# Patient Record
Sex: Female | Born: 2000 | Race: Black or African American | Hispanic: No | Smoking: Never smoker
Health system: Southern US, Community
[De-identification: ages and names within clinical notes are randomized; demographics above are authoritative.]

## PROBLEM LIST (undated history)

## (undated) DIAGNOSIS — O139 Gestational [pregnancy-induced] hypertension without significant proteinuria, unspecified trimester: Secondary | ICD-10-CM

## (undated) DIAGNOSIS — I514 Myocarditis, unspecified: Secondary | ICD-10-CM

## (undated) DIAGNOSIS — J45909 Unspecified asthma, uncomplicated: Secondary | ICD-10-CM

## (undated) HISTORY — PX: CARDIAC SURGERY: SHX584

## (undated) HISTORY — DX: Gestational (pregnancy-induced) hypertension without significant proteinuria, unspecified trimester: O13.9

---
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
## 2021-01-04 ENCOUNTER — Other Ambulatory Visit: Payer: Self-pay | Admitting: *Deleted

## 2021-01-04 NOTE — Patient Outreach (Signed)
Care Coordination  01/04/2021  Ginger Leeth 2001/06/03 [id number]  Khayla Koppenhaver was referred to the [facility] Managed Care High Risk team for assistance with care coordination and care management services. Care coordination/care management services as part of the Medicaid benefit was offered to the patient today. The patient declined assistance offered.   Plan: The Medicaid Managed Care High Risk team is available at any time in the future to assist with care coordination/care management services upon referral.   Estanislado Emms RN, BSN [place]  [facility] RN Care Coordinator

## 2021-01-04 NOTE — Patient Instructions (Signed)
Thank you for taking time to speak with me today about care coordination and care management services available to you at no cost as part of your Medicaid benefit. These services are voluntary. Our team is available to provide assistance regarding your health care needs at any time. Please do not hesitate to reach out to me if we can be of service to you at any time in the future.   Alice Vitelli RN, BSN [place]  [facility] RN Care Coordinator  

## 2021-04-27 ENCOUNTER — Ambulatory Visit
Admission: EM | Admit: 2021-04-27 | Discharge: 2021-04-27 | Disposition: A | Discharge status: Home / Self Care | Payer: Medicaid Other | Attending: Physician Assistant | Admitting: Physician Assistant

## 2021-04-27 ENCOUNTER — Encounter: Payer: Self-pay | Admitting: Emergency Medicine

## 2021-04-27 ENCOUNTER — Other Ambulatory Visit: Payer: Self-pay

## 2021-04-27 DIAGNOSIS — Z3201 Encounter for pregnancy test, result positive: Secondary | ICD-10-CM | POA: Diagnosis not present

## 2021-04-27 DIAGNOSIS — Z3A01 Less than 8 weeks gestation of pregnancy: Secondary | ICD-10-CM

## 2021-04-27 LAB — POCT URINE PREGNANCY: Preg Test, Ur: POSITIVE — AB

## 2021-04-27 MED ORDER — PRENATAL 27-0.8 MG PO TABS: 1.0000 | ORAL_TABLET | Freq: Every day | ORAL | 0 refills | Status: DC

## 2021-04-27 NOTE — ED Triage Notes (Signed)
Nausea and vomiting x 2 days

## 2021-05-02 ENCOUNTER — Other Ambulatory Visit: Payer: Self-pay

## 2021-05-02 ENCOUNTER — Emergency Department (HOSPITAL_COMMUNITY)
Admission: EM | Admit: 2021-05-02 | Discharge: 2021-05-02 | Disposition: A | Discharge status: Home / Self Care | Payer: Medicaid Other | Attending: Emergency Medicine | Admitting: Emergency Medicine

## 2021-05-02 ENCOUNTER — Encounter (HOSPITAL_COMMUNITY): Payer: Self-pay

## 2021-05-02 DIAGNOSIS — J3489 Other specified disorders of nose and nasal sinuses: Secondary | ICD-10-CM | POA: Insufficient documentation

## 2021-05-02 DIAGNOSIS — Z20822 Contact with and (suspected) exposure to covid-19: Secondary | ICD-10-CM | POA: Insufficient documentation

## 2021-05-02 DIAGNOSIS — R0981 Nasal congestion: Secondary | ICD-10-CM | POA: Insufficient documentation

## 2021-05-02 DIAGNOSIS — J45909 Unspecified asthma, uncomplicated: Secondary | ICD-10-CM | POA: Insufficient documentation

## 2021-05-02 DIAGNOSIS — Z79899 Other long term (current) drug therapy: Secondary | ICD-10-CM | POA: Insufficient documentation

## 2021-05-02 DIAGNOSIS — O99511 Diseases of the respiratory system complicating pregnancy, first trimester: Secondary | ICD-10-CM | POA: Diagnosis not present

## 2021-05-02 DIAGNOSIS — Z3A01 Less than 8 weeks gestation of pregnancy: Secondary | ICD-10-CM | POA: Diagnosis not present

## 2021-05-02 LAB — RESP PANEL BY RT-PCR (FLU A&B, COVID) ARPGX2
Influenza A by PCR: NEGATIVE
Influenza B by PCR: NEGATIVE
SARS Coronavirus 2 by RT PCR: NEGATIVE

## 2021-05-02 MED ORDER — CETIRIZINE HCL 10 MG PO TABS: 10.0000 mg | ORAL_TABLET | Freq: Every day | ORAL | 0 refills | Status: DC

## 2021-05-02 MED ORDER — ALBUTEROL SULFATE HFA 108 (90 BASE) MCG/ACT IN AERS
2.0000 | INHALATION_SPRAY | Freq: Once | RESPIRATORY_TRACT | Status: AC
  Administered 2021-05-02: 2 via RESPIRATORY_TRACT
  Filled 2021-05-02: qty 6.7

## 2021-05-02 MED ORDER — FLUTICASONE PROPIONATE 50 MCG/ACT NA SUSP: 2.0000 | Freq: Every day | NASAL | 0 refills | Status: DC

## 2021-05-02 MED ORDER — ALBUTEROL SULFATE HFA 108 (90 BASE) MCG/ACT IN AERS: 1.0000 | INHALATION_SPRAY | Freq: Four times a day (QID) | RESPIRATORY_TRACT | 0 refills | Status: DC | PRN

## 2021-05-02 NOTE — Discharge Instructions (Signed)
Symptoms may be due to a viral upper respiratory infection versus allergic rhinitis.  To treat nasal congestion you can use Zyrtec once daily and Flonase nasal spray once daily.  You can also use saline nasal sprays or nasal rinses to help relieve congestion.  You have a COVID test pending and will be called by phone if results are positive, you can also review negative results online through the MyChart app.  Follow-up with your primary care provider as needed.

## 2021-05-02 NOTE — ED Triage Notes (Signed)
Pt presents to ED with complaints of nasal congestion since last night. Pt denies fever, cough. Pt needs refill on albuterol inhaler.

## 2021-05-02 NOTE — ED Provider Notes (Signed)
[facility] EMERGENCY DEPARTMENT Provider Note   CSN: [id number] Arrival date & time: 05/02/21  0930     History Chief Complaint  Patient presents with   Nasal Congestion    Carly Bates is a 20 y.o. female.  Carly Bates is a 20 y.o. female with a history of asthma and myocarditis, currently approximately 7 weeks and 4 days pregnant, who presents to the ED for evaluation of nasal congestion.  She reports symptoms started overnight and she has had persistent congestion and some rhinorrhea.  She reports congestion seems to be worse on the left than right and she has some associated sinus pressure.  No sore throat or cough.  No associated fevers or chills.  She also reports she has had some intermittent issues with wheezing with the weather change and has run out of her albuterol inhaler.  Patient is currently pregnant and was not sure what would be safe for her to take for nasal congestion.  She has [facility] appointment [facility] for [facility] care.  No abdominal pain, vaginal bleeding or pregnancy related complaints today.  The history is provided by the patient.      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[facility])     Patient Active Problem List   Diagnosis Date Noted   Encounter for initial prescription of transdermal patch hormonal contraceptive device 06/14/2019   Mild intermittent asthma, uncomplicated 10/06/2016    Past Surgical History:  Procedure Laterality Date   CARDIAC SURGERY     108 months of age      OB History     Gravida  1   Para  0   Term  0   Preterm  0   AB  0   Living  0      SAB  0   IAB  0   Ectopic  0   Multiple  0   Live Births  0           Family History  Problem Relation Age of Onset   Diabetes Paternal Grandmother    Diabetes Maternal Grandmother    Hypertension Mother     Social History   Tobacco Use   Smoking status: Never   Smokeless tobacco: Never  Vaping Use    Vaping Use: Never used  Substance Use Topics   Alcohol use: No   Drug use: No    Home Medications Prior to Admission medications   Medication Sig Start Date End Date Taking? Authorizing Provider  cetirizine (ZYRTEC ALLERGY) 10 MG tablet Take 1 tablet (10 mg total) by mouth daily. 05/02/21 06/01/21 Yes Maebry Obrien, Arva Chafe, PA-C  fluticasone (FLONASE) 50 MCG/ACT nasal spray Place 2 sprays into both nostrils daily. 05/02/21  Yes Dartha Lodge, PA-C  albuterol (VENTOLIN HFA) 108 (90 Base) MCG/ACT inhaler Inhale 1-2 puffs into the lungs every 6 (six) hours as needed for wheezing or shortness of breath. 05/02/21   Dartha Lodge, PA-C  benzonatate (TESSALON) 100 MG capsule Take 1 capsule (100 mg total) by mouth every 8 (eight) hours. 12/10/20   Wurst, [place], PA-C  ibuprofen (ADVIL) 800 MG tablet Take 1 tablet (800 mg total) by mouth 3 (three) times daily. Patient not taking: Reported on 07/02/2020 05/28/20   [name], FNP  nitrofurantoin, macrocrystal-monohydrate, (MACROBID) 100 MG capsule Take 1 capsule (100 mg total) by mouth 2 (two) times daily. Patient not taking: Reported on 07/02/2020 03/27/20   Avegno,  Zachery Dakins, FNP  norelgestromin-ethinyl estradiol (ORTHO EVRA) 150-35 MCG/24HR transdermal patch Place 1 patch onto the skin once a week. Patient not taking: Reported on 07/02/2020 06/14/19   [name], NP  phenazopyridine (PYRIDIUM) 100 MG tablet Take 1 tablet (100 mg total) by mouth 3 (three) times daily as needed for pain. Patient not taking: Reported on 04/02/2020 03/27/20   [name], FNP  predniSONE (DELTASONE) 10 MG tablet Take 2 tablets (20 mg total) by mouth daily. 05/28/20   Avegno, Zachery Dakins, FNP  Prenatal Vit-Fe Fumarate-FA (MULTIVITAMIN-PRENATAL) 27-0.8 MG TABS tablet Take 1 tablet by mouth daily at 12 noon. 04/27/21   Elson Areas, PA-C    Allergies    Penicillins  Review of Systems   Review of Systems  Constitutional:  Negative for chills and fever.   HENT:  Positive for congestion, rhinorrhea and sinus pain. Negative for sore throat.   Respiratory:  Negative for cough.   Gastrointestinal:  Negative for abdominal pain.  Musculoskeletal:  Negative for myalgias.  Neurological:  Negative for headaches.  All other systems reviewed and are negative.  Physical Exam Updated Vital Signs BP 139/90 (BP Location: Right Arm)   Pulse 90   Temp 98.2 F (36.8 C) (Oral)   Resp 18   Ht 5\' 3"  (1.6 m)   Wt 84.1 kg   LMP 03/10/2021 (Exact Date)   SpO2 100%   BMI 32.82 kg/m   Physical Exam Vitals and nursing note reviewed.  Constitutional:      General: She is not in acute distress.    Appearance: Normal appearance. She is well-developed. She is not ill-appearing or diaphoretic.  HENT:     Head: Normocephalic and atraumatic.     Nose: Rhinorrhea present.     Mouth/Throat:     Mouth: Mucous membranes are moist.     Pharynx: Oropharynx is clear.  Eyes:     General:        Right eye: No discharge.        Left eye: No discharge.  Neck:     Comments: No rigidity Cardiovascular:     Rate and Rhythm: Normal rate and regular rhythm.     Heart sounds: Normal heart sounds. No murmur heard.   No friction rub. No gallop.  Pulmonary:     Effort: Pulmonary effort is normal. No respiratory distress.     Breath sounds: Normal breath sounds. No wheezing.     Comments: Respirations equal and unlabored, patient able to speak in full sentences, lungs clear to auscultation bilaterally  Abdominal:     General: There is no distension.     Palpations: Abdomen is soft.     Tenderness: There is no abdominal tenderness.     Comments: Abdomen soft, nondistended, nontender to palpation in all quadrants without guarding or peritoneal signs  Musculoskeletal:        General: No deformity.     Cervical back: Neck supple.  Lymphadenopathy:     Cervical: No cervical adenopathy.  Skin:    General: Skin is warm and dry.     Capillary Refill: Capillary refill  takes less than 2 seconds.  Neurological:     Mental Status: She is alert and oriented to person, place, and time.  Psychiatric:        Mood and Affect: Mood normal.        Behavior: Behavior normal.    ED Results / Procedures / Treatments   Labs (all labs ordered are  listed, but only abnormal results are displayed) Labs Reviewed  RESP PANEL BY RT-PCR (FLU A&B, COVID) [id number]    EKG None  Radiology No results found.  Procedures Procedures   Medications Ordered in ED Medications  albuterol (VENTOLIN HFA) 108 (90 Base) MCG/ACT inhaler 2 puff (2 puffs Inhalation Given 05/02/21 1029)    ED Course  I have reviewed the triage vital signs and the nursing notes.  Pertinent labs & imaging results that were available during my care of the patient were reviewed by me and considered in my medical decision making (see chart for details).    MDM Rules/Calculators/A&P                           20 year old female presents with nasal congestion starting last night.  No associated fevers, sore throat or cough.  Currently about [redacted] weeks pregnant and was unsure what was safe for her to take.  She also is requesting a refill of albuterol inhaler, she has been having some intermittent wheezing with weather change and ran out of this.  No current wheezing on exam.  Will send COVID test and patient provided albuterol inhaler.  Encourage patient to use Flonase and Zyrtec to help with nasal congestion.  I have sent in prescriptions for these medications for the patient.  Discussed appropriate return precautions and follow-up.  No pregnancy related complaints today and she already has outpatient OB/GYN follow-up scheduled for 10/17.  Discharged home in good condition.  Final Clinical Impression(s) / ED Diagnoses Final diagnoses:  Nasal congestion    Rx / DC Orders ED Discharge Orders          Ordered    cetirizine (ZYRTEC ALLERGY) 10 MG tablet  Daily        05/02/21 1011    fluticasone  (FLONASE) 50 MCG/ACT nasal spray  Daily        05/02/21 1011    albuterol (VENTOLIN HFA) 108 (90 Base) MCG/ACT inhaler  Every 6 hours PRN        05/02/21 1011             Dartha Lodge, [place] 05/02/21 1145    Pollyann Savoy, MD 05/02/21 1247

## 2021-05-03 ENCOUNTER — Telehealth: Payer: Self-pay

## 2021-05-03 NOTE — Telephone Encounter (Signed)
Transition Care Management Follow-up Telephone Call Date of discharge and from where: 05/02/2021- How have you been since you were released from the hospital? Patient stated she is doing ok. Any questions or concerns? No  Items Reviewed: Did the pt receive and understand the discharge instructions provided? Yes  Medications obtained and verified? Yes  Other? No  Any new allergies since your discharge? No  Dietary orders reviewed? No Do you have support at home? Yes   Home Care and Equipment/Supplies: Were home health services ordered? not applicable If so, what is the name of the agency? N/A  Has the agency set up a time to come to the patient's home? not applicable Were any new equipment or medical supplies ordered?  No What is the name of the medical supply agency? N/A Were you able to get the supplies/equipment? not applicable Do you have any questions related to the use of the equipment or supplies? No  Functional Questionnaire: (I = Independent and D = Dependent) ADLs: I  Bathing/Dressing- I  Meal Prep- I  Eating- I  Maintaining continence- I  Transferring/Ambulation- I  Managing Meds- I  Follow up appointments reviewed:  PCP Hospital f/u appt confirmed? No   Specialist Hospital f/u appt confirmed? No   Are transportation arrangements needed? No  If their condition worsens, is the pt aware to call PCP or go to the Emergency Dept.? Yes Was the patient provided with contact information for the PCP's office or ED? Yes Was to pt encouraged to call back with questions or concerns? Yes

## 2021-05-04 NOTE — ED Provider Notes (Signed)
[facility]    CSN: [id number] Arrival date & time: 04/27/21  1837      History   Chief Complaint No chief complaint on file.   HPI Carly Bates is a 20 y.o. female.   Pt complains of nausea and vomiting in the am.   The history is provided by the patient. No language interpreter was used.   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[facility])     Patient Active Problem List   Diagnosis Date Noted   Encounter for initial prescription of transdermal patch hormonal contraceptive device 06/14/2019   Mild intermittent asthma, uncomplicated 10/06/2016    Past Surgical History:  Procedure Laterality Date   CARDIAC SURGERY     75 months of age     OB History     Gravida  1   Para  0   Term  0   Preterm  0   AB  0   Living  0      SAB  0   IAB  0   Ectopic  0   Multiple  0   Live Births  0            Home Medications    Prior to Admission medications   Medication Sig Start Date End Date Taking? Authorizing Provider  Prenatal Vit-Fe Fumarate-FA (MULTIVITAMIN-PRENATAL) 27-0.8 MG TABS tablet Take 1 tablet by mouth daily at 12 noon. 04/27/21  Yes Cheron Schaumann K, PA-C  albuterol (VENTOLIN HFA) 108 (90 Base) MCG/ACT inhaler Inhale 1-2 puffs into the lungs every 6 (six) hours as needed for wheezing or shortness of breath. 05/02/21   Dartha Lodge, PA-C  benzonatate (TESSALON) 100 MG capsule Take 1 capsule (100 mg total) by mouth every 8 (eight) hours. 12/10/20   Wurst, [place], PA-C  cetirizine (ZYRTEC ALLERGY) 10 MG tablet Take 1 tablet (10 mg total) by mouth daily. 05/02/21 06/01/21  Dartha Lodge, PA-C  fluticasone (FLONASE) 50 MCG/ACT nasal spray Place 2 sprays into both nostrils daily. 05/02/21   Dartha Lodge, PA-C  ibuprofen (ADVIL) 800 MG tablet Take 1 tablet (800 mg total) by mouth 3 (three) times daily. Patient not taking: Reported on 07/02/2020 05/28/20   [name], FNP  nitrofurantoin,  macrocrystal-monohydrate, (MACROBID) 100 MG capsule Take 1 capsule (100 mg total) by mouth 2 (two) times daily. Patient not taking: Reported on 07/02/2020 03/27/20   [name], FNP  norelgestromin-ethinyl estradiol (ORTHO EVRA) 150-35 MCG/24HR transdermal patch Place 1 patch onto the skin once a week. Patient not taking: Reported on 07/02/2020 06/14/19   [name], NP  phenazopyridine (PYRIDIUM) 100 MG tablet Take 1 tablet (100 mg total) by mouth 3 (three) times daily as needed for pain. Patient not taking: Reported on 04/02/2020 03/27/20   [name], FNP  predniSONE (DELTASONE) 10 MG tablet Take 2 tablets (20 mg total) by mouth daily. 05/28/20   AvegnoZachery Dakins, FNP    Family History Family History  Problem Relation Age of Onset   Diabetes Paternal Grandmother    Diabetes Maternal Grandmother    Hypertension Mother     Social History Social History   Tobacco Use   Smoking status: Never   Smokeless tobacco: Never  Vaping Use   Vaping Use: Never used  Substance Use Topics   Alcohol use: No   Drug use: No     Allergies   Penicillins   Review of Systems  Review of Systems  All other systems reviewed and are negative.   Physical Exam Triage Vital Signs ED Triage Vitals [04/27/21 1937]  Enc Vitals Group     BP 119/83     Pulse Rate (!) 104     Resp 18     Temp 97.9 F (36.6 C)     Temp Source Oral     SpO2 98 %     Weight      Height      Head Circumference      Peak Flow      Pain Score 0     Pain Loc      Pain Edu?      Excl. in GC?    No data found.  Updated Vital Signs BP 119/83 (BP Location: Right Arm)   Pulse (!) 104   Temp 97.9 F (36.6 C) (Oral)   Resp 18   LMP 03/10/2021 (Exact Date)   SpO2 98%   Visual Acuity Right Eye Distance:   Left Eye Distance:   Bilateral Distance:    Right Eye Near:   Left Eye Near:    Bilateral Near:     Physical Exam Vitals and nursing note reviewed.  Constitutional:       Appearance: She is well-developed.  HENT:     Head: Normocephalic.  Pulmonary:     Effort: Pulmonary effort is normal.  Abdominal:     General: There is no distension.  Musculoskeletal:        General: Normal range of motion.     Cervical back: Normal range of motion.  Neurological:     Mental Status: She is alert and oriented to person, place, and time.     UC Treatments / Results  Labs (all labs ordered are listed, but only abnormal results are displayed) Labs Reviewed  POCT URINE PREGNANCY - Abnormal; Notable for the following components:      Result Value   Preg Test, Ur Positive (*)    All other components within normal limits    EKG   Radiology No results found.  Procedures Procedures (including critical care time)  Medications Ordered in UC Medications - No data to display  Initial Impression / Assessment and Plan / UC Course  I have reviewed the triage vital signs and the nursing notes.  Pertinent labs & imaging results that were available during my care of the patient were reviewed by me and considered in my medical decision making (see chart for details).     MDM:  pretgnancy is positve.  Pt given rx for prenatal vitamins Final Clinical Impressions(s) / UC Diagnoses   Final diagnoses:  Less than [redacted] weeks gestation of pregnancy   Discharge Instructions   None    ED Prescriptions     Medication Sig Dispense Auth. Provider   Prenatal Vit-Fe Fumarate-FA (MULTIVITAMIN-PRENATAL) 27-0.8 MG TABS tablet Take 1 tablet by mouth daily at 12 noon. 30 tablet Elson Areas, [place]      PDMP not reviewed this encounter. An After Visit Summary was printed and given to the patient.    Elson Areas, [place] 05/04/21 [id number]

## 2021-05-13 ENCOUNTER — Other Ambulatory Visit: Payer: Self-pay | Admitting: Obstetrics & Gynecology

## 2021-05-13 DIAGNOSIS — O3680X0 Pregnancy with inconclusive fetal viability, not applicable or unspecified: Secondary | ICD-10-CM

## 2021-05-14 ENCOUNTER — Other Ambulatory Visit: Payer: Self-pay

## 2021-05-14 ENCOUNTER — Ambulatory Visit (INDEPENDENT_AMBULATORY_CARE_PROVIDER_SITE_OTHER): Payer: Medicaid Other

## 2021-05-14 DIAGNOSIS — O3680X0 Pregnancy with inconclusive fetal viability, not applicable or unspecified: Secondary | ICD-10-CM

## 2021-05-14 DIAGNOSIS — Z3A09 9 weeks gestation of pregnancy: Secondary | ICD-10-CM | POA: Diagnosis not present

## 2021-05-14 NOTE — Progress Notes (Signed)
[name] 9+2 wks,single IUP with YS,positive fht 171 BPM,normal ovaries,crl 24.65 mm

## 2021-05-17 ENCOUNTER — Other Ambulatory Visit: Payer: Medicaid Other

## 2021-05-31 DIAGNOSIS — J209 Acute bronchitis, unspecified: Secondary | ICD-10-CM | POA: Diagnosis not present

## 2021-06-11 ENCOUNTER — Other Ambulatory Visit: Payer: Self-pay | Admitting: Women's Health

## 2021-06-11 DIAGNOSIS — Z3682 Encounter for antenatal screening for nuchal translucency: Secondary | ICD-10-CM

## 2021-06-14 ENCOUNTER — Encounter: Payer: Self-pay | Admitting: Women's Health

## 2021-06-14 ENCOUNTER — Ambulatory Visit (INDEPENDENT_AMBULATORY_CARE_PROVIDER_SITE_OTHER): Payer: Medicaid Other | Admitting: Women's Health

## 2021-06-14 ENCOUNTER — Ambulatory Visit (INDEPENDENT_AMBULATORY_CARE_PROVIDER_SITE_OTHER): Payer: Medicaid Other

## 2021-06-14 ENCOUNTER — Ambulatory Visit: Payer: Medicaid Other | Admitting: *Deleted

## 2021-06-14 ENCOUNTER — Other Ambulatory Visit: Payer: Self-pay

## 2021-06-14 VITALS — BP 121/80 | HR 82 | Wt 172.0 lb

## 2021-06-14 DIAGNOSIS — Z131 Encounter for screening for diabetes mellitus: Secondary | ICD-10-CM | POA: Diagnosis not present

## 2021-06-14 DIAGNOSIS — I469 Cardiac arrest, cause unspecified: Secondary | ICD-10-CM

## 2021-06-14 DIAGNOSIS — Z3682 Encounter for antenatal screening for nuchal translucency: Secondary | ICD-10-CM | POA: Diagnosis not present

## 2021-06-14 DIAGNOSIS — Z31430 Encounter of female for testing for genetic disease carrier status for procreative management: Secondary | ICD-10-CM | POA: Diagnosis not present

## 2021-06-14 DIAGNOSIS — Z3400 Encounter for supervision of normal first pregnancy, unspecified trimester: Secondary | ICD-10-CM | POA: Insufficient documentation

## 2021-06-14 DIAGNOSIS — Z3401 Encounter for supervision of normal first pregnancy, first trimester: Secondary | ICD-10-CM

## 2021-06-14 DIAGNOSIS — Z3A13 13 weeks gestation of pregnancy: Secondary | ICD-10-CM | POA: Diagnosis not present

## 2021-06-14 HISTORY — DX: Cardiac arrest, cause unspecified: I46.9

## 2021-06-14 LAB — POCT URINALYSIS DIPSTICK OB
Blood, UA: NEGATIVE
Glucose, UA: NEGATIVE
Ketones, UA: NEGATIVE
Leukocytes, UA: NEGATIVE
Nitrite, UA: NEGATIVE
POC,PROTEIN,UA: NEGATIVE

## 2021-06-14 MED ORDER — ASPIRIN 81 MG PO TBEC: 81.0000 mg | DELAYED_RELEASE_TABLET | Freq: Every day | ORAL | 3 refills | Status: DC

## 2021-06-14 MED ORDER — BLOOD PRESSURE MONITOR MISC: 0 refills | Status: DC

## 2021-06-14 MED ORDER — DOXYLAMINE-PYRIDOXINE 10-10 MG PO TBEC: DELAYED_RELEASE_TABLET | ORAL | 6 refills | Status: DC

## 2021-06-14 NOTE — Progress Notes (Signed)
INITIAL OBSTETRICAL VISIT Patient name: Carly Bates MRN [id number]  Date of birth: April 05, 2001 Chief Complaint:   Initial Prenatal Visit (nausea)  History of Present Illness:   Carly Bates is a 20 y.o. G31P0000 African-American female at [redacted]w[redacted]d by LMP c/w u/s at 9 weeks with an Estimated Date of Delivery: 12/15/21 being seen today for her initial obstetrical visit.   Patient's last menstrual period was 03/10/2021. Her obstetrical history is significant for primigravida.   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 Does not see cardiology. Mom passed away @ age of 108yo d/t MI vs asthma attack. Pt denies have DM.  Last pap <21yo. Results were: N/A  Depression screen [facility] 2/9 06/14/2021 04/21/2020  Decreased Interest 2 0  Down, Depressed, Hopeless 0 0  PHQ - 2 Score 2 0  Altered sleeping 1 -  Tired, decreased energy 1 -  Change in appetite 1 -  Feeling bad or failure about yourself  0 -  Trouble concentrating 0 -  Moving slowly or fidgety/restless 0 -  Suicidal thoughts 0 -  PHQ-9 Score 5 -     GAD 7 : Generalized Anxiety Score 06/14/2021  Nervous, Anxious, on Edge 0  Control/stop worrying 1  Worry too much - different things 0  Trouble relaxing 0  Restless 0  Easily annoyed or irritable 1  Afraid - awful might happen 0  Total GAD 7 Score 2     Review of Systems:   Pertinent items are noted in HPI Denies cramping/contractions, leakage of fluid, vaginal bleeding, abnormal vaginal discharge w/ itching/odor/irritation, headaches, visual changes, shortness of breath, chest pain, abdominal pain, severe nausea/vomiting, or problems with urination or bowel movements unless otherwise stated above.  Pertinent History Reviewed:  Reviewed past medical,surgical, social,  obstetrical and family history.  Reviewed problem list, medications and allergies. OB History  Gravida Para Term Preterm AB Living  1 0 0 0 0 0  SAB IAB Ectopic Multiple Live Births  0 0 0 0 0    # Outcome Date GA Lbr Len/2nd Weight Sex Delivery Anes PTL Lv  1 Current            Physical Assessment:   Vitals:   06/14/21 0947  BP: 121/80  Pulse: 82  Weight: 172 lb (78 kg)  Body mass index is 30.47 kg/m.       Physical Examination:  General appearance - well appearing, and in no distress  Mental status - alert, oriented to person, place, and time  Psych:  She has a normal mood and affect  Skin - warm and dry, normal color, no suspicious lesions noted  Chest - effort normal, all lung fields clear to auscultation bilaterally  Heart - normal rate and regular rhythm  Abdomen - soft, nontender  Extremities:  No swelling or varicosities noted  Thin prep pap is not done   Chaperone: N/A    TODAY'S NT [name] 13+5 wks,measurements c/w dates,CRL 79.01 mm,FHR 155 bpm,NB present,posterior placenta,normal ovaries,unable to obtain NT because of fetal position (this was 2nd attempt after our visit, will offer AFP next visit)  Results for orders placed or performed in visit on 06/14/21 (from the past 24 hour(s))  POC Urinalysis Dipstick OB   Collection Time: 06/14/21 10:13 AM  Result Value Ref Range   Color,  UA     Clarity, UA     Glucose, UA Negative Negative   Bilirubin, UA     Ketones, UA neg    Spec Grav, UA     Blood, UA neg    pH, UA     POC,PROTEIN,UA Negative Negative, Trace, Small (1+), Moderate (2+), Large (3+), 4+   Urobilinogen, UA     Nitrite, UA neg    Leukocytes, UA Negative Negative   Appearance     Odor      Assessment & Plan:  1) Low-Risk Pregnancy G1P0000 at [redacted]w[redacted]d with an Estimated Date of Delivery: 12/15/21   2) Initial OB visit  3) 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 Doesn't see cardiologist. Denies DM. Unable to see records in [facility], will request op note & d/c summary at her next visit.   Meds:  Meds ordered this encounter  Medications   Blood Pressure Monitor MISC    Sig: For regular home bp monitoring during pregnancy    Dispense:  1 each    Refill:  0    Z34.81 Please mail to patient   aspirin 81 MG EC tablet    Sig: Take 1 tablet (81 mg total) by mouth daily. Swallow whole.    Dispense:  90 tablet    Refill:  3    Order Specific Question:   Supervising Provider    Answer:   Duane Lope H [[id number]]   Doxylamine-Pyridoxine (DICLEGIS) 10-10 MG TBEC    Sig: 2 tabs q hs, if sx persist add 1 tab q am on day 3, if sx persist add 1 tab q afternoon on day 4    Dispense:  100 tablet    Refill:  6    Order Specific Question:   Supervising Provider    Answer:   Duane Lope H [[id number]]    Initial labs obtained Continue prenatal vitamins Reviewed n/v relief measures and warning s/s to report Reviewed recommended weight gain based on pre-gravid BMI Encouraged well-balanced diet Genetic & carrier screening discussed: requests Panorama, NT/IT, and Horizon  Ultrasound discussed; fetal survey: requested CCNC completed> form faxed if has or is planning to apply for medicaid The nature of [employer] for [employer] with multiple MDs and other Advanced Practice Providers was explained to patient; also emphasized that fellows, residents, and students are part of our team. Does not have home bp cuff. Office bp cuff given: no. Rx sent: yes. Check bp weekly, let us know if consistently >140/90.   Indications for ASA therapy (per uptodate) OR Two or more of the following: Nulliparity Yes Obesity (BMI>30 kg/m2) Yes Sociodemographic characteristics (African American race, low socioeconomic level) Yes  Indications for early A1C (per uptodate) BMI  >=25 (>=23 in Asian women) AND one of the following First-degree relative with diabetes Yes-sister High-risk race/ethnicity (eg, African American, Latino, Native American, [place] American, [place] Islander) Yes  Follow-up: Return in about 3 weeks (around 07/05/2021) for LROB, 2nd IT, CNM, in person.   Orders Placed This Encounter  Procedures   Urine Culture   GC/Chlamydia Probe Amp   Pain Management Screening Profile (10S)   CBC/D/Plt+RPR+Rh+ABO+RubIgG...   Genetic Screening   Hemoglobin A1c   POC Urinalysis Dipstick OB    Cheral Marker CNM, [facility] 06/14/2021 12:53 PM

## 2021-06-14 NOTE — Patient Instructions (Signed)
Carly Bates, thank you for choosing our office today! We appreciate the opportunity to meet your healthcare needs. You may receive a short survey by mail, e-mail, or through [employer]. If you are happy with your care we would appreciate if you could take just a few minutes to complete the survey questions. We read all of your comments and take your feedback very seriously. Thank you again for choosing our office.  Center for [employer] [facility] Team at [facility]  [facility] at [facility] ([address] [place], [place] [address]) Entrance C, located off of [address] Free 24/7 valet parking   Nausea & Vomiting Have saltine crackers or pretzels by your bed and eat a few bites before you raise your head out of bed in the morning Eat small frequent meals throughout the day instead of large meals Drink plenty of fluids throughout the day to stay hydrated, just don't drink a lot of fluids with your meals.  This can make your stomach fill up faster making you feel sick Do not brush your teeth right after you eat Products with real ginger are good for nausea, like ginger ale and ginger hard candy Make sure it says made with real ginger! Sucking on sour candy like lemon heads is also good for nausea If your prenatal vitamins make you nauseated, take them at night so you will sleep through the nausea Sea Bands If you feel like you need medicine for the nausea & vomiting please let us know If you are unable to keep any fluids or food down please let us know   Constipation Drink plenty of fluid, preferably water, throughout the day Eat foods high in fiber such as fruits, vegetables, and grains Exercise, such as walking, is a good way to keep your bowels regular Drink warm fluids, especially warm prune juice, or decaf coffee Eat a 1/2 cup of real oatmeal (not instant), 1/2 cup applesauce, and 1/2-1 cup warm prune juice every day If needed, you may take Colace (docusate sodium) stool  softener once or twice a day to help keep the stool soft.  If you still are having problems with constipation, you may take Miralax once daily as needed to help keep your bowels regular.   Home Blood Pressure Monitoring for Patients   Your provider has recommended that you check your blood pressure (BP) at least once a week at home. If you do not have a blood pressure cuff at home, one will be provided for you. Contact your provider if you have not received your monitor within 1 week.   Helpful Tips for Accurate Home Blood Pressure Checks  Don't smoke, exercise, or drink caffeine 30 minutes before checking your BP Use the restroom before checking your BP (a full bladder can raise your pressure) Relax in a comfortable upright chair Feet on the ground Left arm resting comfortably on a flat surface at the level of your heart Legs uncrossed Back supported Sit quietly and don't talk Place the cuff on your bare arm Adjust snuggly, so that only two fingertips can fit between your skin and the top of the cuff Check 2 readings separated by at least one minute Keep a log of your BP readings For a visual, please reference this diagram: http://ccnc.care/bpdiagram  Provider Name: [facility]     Phone: [phone]  Zone 1: ALL CLEAR  Continue to monitor your symptoms:  BP reading is less than 140 (top number) or less than 90 (bottom  number)  No right upper stomach pain No headaches or seeing spots No feeling nauseated or throwing up No swelling in face and hands  Zone 2: CAUTION Call your doctor's office for any of the following:  BP reading is greater than 140 (top number) or greater than 90 (bottom number)  Stomach pain under your ribs in the middle or right side Headaches or seeing spots Feeling nauseated or throwing up Swelling in face and hands  Zone 3: EMERGENCY  Seek immediate medical care if you have any of the following:  BP reading is greater than160 (top number) or  greater than 110 (bottom number) Severe headaches not improving with Tylenol Serious difficulty catching your breath Any worsening symptoms from Zone 2    First Trimester of Pregnancy The first trimester of pregnancy is from week 1 until the end of week 12 (months 1 through 3). A week after a sperm fertilizes an egg, the egg will implant on the wall of the uterus. This embryo will begin to develop into a baby. Genes from you and your partner are forming the baby. The female genes determine whether the baby is a boy or a girl. At 6-8 weeks, the eyes and face are formed, and the heartbeat can be seen on ultrasound. At the end of 12 weeks, all the baby's organs are formed.  Now that you are pregnant, you will want to do everything you can to have a healthy baby. Two of the most important things are to get good prenatal care and to follow your health care provider's instructions. Prenatal care is all the medical care you receive before the baby's birth. This care will help prevent, find, and treat any problems during the pregnancy and childbirth. BODY CHANGES Your body goes through many changes during pregnancy. The changes vary from woman to woman.  You may gain or lose a couple of pounds at first. You may feel sick to your stomach (nauseous) and throw up (vomit). If the vomiting is uncontrollable, call your health care provider. You may tire easily. You may develop headaches that can be relieved by medicines approved by your health care provider. You may urinate more often. Painful urination may mean you have a bladder infection. You may develop heartburn as a result of your pregnancy. You may develop constipation because certain hormones are causing the muscles that push waste through your intestines to slow down. You may develop hemorrhoids or swollen, bulging veins (varicose veins). Your breasts may begin to grow larger and become tender. Your nipples may stick out more, and the tissue that  surrounds them (areola) may become darker. Your gums may bleed and may be sensitive to brushing and flossing. Dark spots or blotches (chloasma, mask of pregnancy) may develop on your face. This will likely fade after the baby is born. Your menstrual periods will stop. You may have a loss of appetite. You may develop cravings for certain kinds of food. You may have changes in your emotions from day to day, such as being excited to be pregnant or being concerned that something may go wrong with the pregnancy and baby. You may have more vivid and strange dreams. You may have changes in your hair. These can include thickening of your hair, rapid growth, and changes in texture. Some women also have hair loss during or after pregnancy, or hair that feels dry or thin. Your hair will most likely return to normal after your baby is born. WHAT TO EXPECT AT YOUR PRENATAL  VISITS During a routine prenatal visit: You will be weighed to make sure you and the baby are growing normally. Your blood pressure will be taken. Your abdomen will be measured to track your baby's growth. The fetal heartbeat will be listened to starting around week 10 or 12 of your pregnancy. Test results from any previous visits will be discussed. Your health care provider may ask you: How you are feeling. If you are feeling the baby move. If you have had any abnormal symptoms, such as leaking fluid, bleeding, severe headaches, or abdominal cramping. If you have any questions. Other tests that may be performed during your first trimester include: Blood tests to find your blood type and to check for the presence of any previous infections. They will also be used to check for low iron levels (anemia) and Rh antibodies. Later in the pregnancy, blood tests for diabetes will be done along with other tests if problems develop. Urine tests to check for infections, diabetes, or protein in the urine. An ultrasound to confirm the proper growth  and development of the baby. An amniocentesis to check for possible genetic problems. Fetal screens for spina bifida and Down syndrome. You may need other tests to make sure you and the baby are doing well. HOME CARE INSTRUCTIONS  Medicines Follow your health care provider's instructions regarding medicine use. Specific medicines may be either safe or unsafe to take during pregnancy. Take your prenatal vitamins as directed. If you develop constipation, try taking a stool softener if your health care provider approves. Diet Eat regular, well-balanced meals. Choose a variety of foods, such as meat or vegetable-based protein, fish, milk and low-fat dairy products, vegetables, fruits, and whole grain breads and cereals. Your health care provider will help you determine the amount of weight gain that is right for you. Avoid raw meat and uncooked cheese. These carry germs that can cause birth defects in the baby. Eating four or five small meals rather than three large meals a day may help relieve nausea and vomiting. If you start to feel nauseous, eating a few soda crackers can be helpful. Drinking liquids between meals instead of during meals also seems to help nausea and vomiting. If you develop constipation, eat more high-fiber foods, such as fresh vegetables or fruit and whole grains. Drink enough fluids to keep your urine clear or pale yellow. Activity and Exercise Exercise only as directed by your health care provider. Exercising will help you: Control your weight. Stay in shape. Be prepared for labor and delivery. Experiencing pain or cramping in the lower abdomen or low back is a good sign that you should stop exercising. Check with your health care provider before continuing normal exercises. Try to avoid standing for long periods of time. Move your legs often if you must stand in one place for a long time. Avoid heavy lifting. Wear low-heeled shoes, and practice good posture. You may  continue to have sex unless your health care provider directs you otherwise. Relief of Pain or Discomfort Wear a good support bra for breast tenderness.   Take warm sitz baths to soothe any pain or discomfort caused by hemorrhoids. Use hemorrhoid cream if your health care provider approves.   Rest with your legs elevated if you have leg cramps or low back pain. If you develop varicose veins in your legs, wear support hose. Elevate your feet for 15 minutes, 3-4 times a day. Limit salt in your diet. Prenatal Care Schedule your prenatal visits by the  twelfth week of pregnancy. They are usually scheduled monthly at first, then more often in the last 2 months before delivery. Write down your questions. Take them to your prenatal visits. Keep all your prenatal visits as directed by your health care provider. Safety Wear your seat belt at all times when driving. Make a list of emergency phone numbers, including numbers for family, friends, the hospital, and police and fire departments. General Tips Ask your health care provider for a referral to a local prenatal education class. Begin classes no later than at the beginning of month 6 of your pregnancy. Ask for help if you have counseling or nutritional needs during pregnancy. Your health care provider can offer advice or refer you to specialists for help with various needs. Do not use hot tubs, steam rooms, or saunas. Do not douche or use tampons or scented sanitary pads. Do not cross your legs for long periods of time. Avoid cat litter boxes and soil used by cats. These carry germs that can cause birth defects in the baby and possibly loss of the fetus by miscarriage or stillbirth. Avoid all smoking, herbs, alcohol, and medicines not prescribed by your health care provider. Chemicals in these affect the formation and growth of the baby. Schedule a dentist appointment. At home, brush your teeth with a soft toothbrush and be gentle when you floss. SEEK  MEDICAL CARE IF:  You have dizziness. You have mild pelvic cramps, pelvic pressure, or nagging pain in the abdominal area. You have persistent nausea, vomiting, or diarrhea. You have a bad smelling vaginal discharge. You have pain with urination. You notice increased swelling in your face, hands, legs, or ankles. SEEK IMMEDIATE MEDICAL CARE IF:  You have a fever. You are leaking fluid from your vagina. You have spotting or bleeding from your vagina. You have severe abdominal cramping or pain. You have rapid weight gain or loss. You vomit blood or material that looks like coffee grounds. You are exposed to [name] measles and have never had them. You are exposed to fifth disease or chickenpox. You develop a severe headache. You have shortness of breath. You have any kind of trauma, such as from a fall or a car accident. Document Released: 07/12/2001 Document Revised: 12/02/2013 Document Reviewed: 05/28/2013 [facility] Patient Information 2015 [place], [place]. This information is not intended to replace advice given to you by your health care provider. Make sure you discuss any questions you have with your health care provider.

## 2021-06-14 NOTE — Progress Notes (Signed)
[name] 13+5 wks,measurements c/w dates,CRL 79.01 mm,FHR 155 bpm,NB present,posterior placenta,normal ovaries

## 2021-06-15 LAB — CBC/D/PLT+RPR+RH+ABO+RUBIGG...
Antibody Screen: NEGATIVE
Basophils Absolute: 0.1 10*3/uL (ref 0.0–0.2)
Basos: 1 %
EOS (ABSOLUTE): 0.2 10*3/uL (ref 0.0–0.4)
Eos: 2 %
HCV Ab: <0.1 s/co ratio (ref 0.0–0.9)
HIV Screen 4th Generation wRfx: NONREACTIVE
Hematocrit: 40.3 % (ref 34.0–46.6)
Hemoglobin: 13.7 g/dL (ref 11.1–15.9)
Hepatitis B Surface Ag: NEGATIVE
Immature Grans (Abs): 0 10*3/uL (ref 0.0–0.1)
Immature Granulocytes: 0 %
Lymphocytes Absolute: 2.1 10*3/uL (ref 0.7–3.1)
Lymphs: 26 %
MCH: 28 pg (ref 26.6–33.0)
MCHC: 34 g/dL (ref 31.5–35.7)
MCV: 82 fL (ref 79–97)
Monocytes Absolute: 0.6 10*3/uL (ref 0.1–0.9)
Monocytes: 7 %
Neutrophils Absolute: 5.3 10*3/uL (ref 1.4–7.0)
Neutrophils: 64 %
Platelets: 403 10*3/uL (ref 150–450)
RBC: 4.89 x10E6/uL (ref 3.77–5.28)
RDW: 13.5 % (ref 11.7–15.4)
RPR Ser Ql: NONREACTIVE
Rh Factor: POSITIVE
Rubella Antibodies, IGG: 3.38 index (ref >0.99)
WBC: 8.3 10*3/uL (ref 3.4–10.8)

## 2021-06-15 LAB — PMP SCREEN PROFILE (10S), URINE
Amphetamine Scrn, Ur: NEGATIVE ng/mL
BARBITURATE SCREEN URINE: NEGATIVE ng/mL
BENZODIAZEPINE SCREEN, URINE: NEGATIVE ng/mL
CANNABINOIDS UR QL SCN: NEGATIVE ng/mL
Cocaine (Metab) Scrn, Ur: NEGATIVE ng/mL
Creatinine(Crt), U: 367.7 mg/dL — ABNORMAL HIGH (ref 20.0–300.0)
Methadone Screen, Urine: NEGATIVE ng/mL
OXYCODONE+OXYMORPHONE UR QL SCN: NEGATIVE ng/mL
Opiate Scrn, Ur: NEGATIVE ng/mL
Ph of Urine: 5.6 (ref 4.5–8.9)
Phencyclidine Qn, Ur: NEGATIVE ng/mL
Propoxyphene Scrn, Ur: NEGATIVE ng/mL

## 2021-06-15 LAB — HEMOGLOBIN A1C
Est. average glucose Bld gHb Est-mCnc: 108 mg/dL
Hgb A1c MFr Bld: 5.4 % (ref 4.8–5.6)

## 2021-06-15 LAB — HCV INTERPRETATION

## 2021-06-16 LAB — GC/CHLAMYDIA PROBE AMP
Chlamydia trachomatis, NAA: NEGATIVE
Neisseria Gonorrhoeae by PCR: NEGATIVE

## 2021-06-18 LAB — URINE CULTURE

## 2021-06-22 DIAGNOSIS — Z3401 Encounter for supervision of normal first pregnancy, first trimester: Secondary | ICD-10-CM | POA: Diagnosis not present

## 2021-06-23 DIAGNOSIS — J209 Acute bronchitis, unspecified: Secondary | ICD-10-CM | POA: Diagnosis not present

## 2021-06-23 DIAGNOSIS — J069 Acute upper respiratory infection, unspecified: Secondary | ICD-10-CM | POA: Diagnosis not present

## 2021-06-28 ENCOUNTER — Telehealth: Payer: Self-pay | Admitting: Women's Health

## 2021-06-28 NOTE — Telephone Encounter (Signed)
Returned pt's call. Two identifiers used. Pt given safe meds to take during pregnancy via Mychart. Pt instructed to treat symptoms and follow the advice she was given at urgent care. Pt confirmed understanding.

## 2021-06-28 NOTE — Telephone Encounter (Signed)
Patient went to urgent care and was told her bronchitis was flaring up and was wanting to see if there was anything she could take for it.

## 2021-07-05 ENCOUNTER — Ambulatory Visit (INDEPENDENT_AMBULATORY_CARE_PROVIDER_SITE_OTHER): Payer: Medicaid Other | Admitting: Women's Health

## 2021-07-05 ENCOUNTER — Encounter: Payer: Self-pay | Admitting: Women's Health

## 2021-07-05 ENCOUNTER — Other Ambulatory Visit: Payer: Self-pay

## 2021-07-05 VITALS — BP 123/73 | HR 98 | Wt 166.0 lb

## 2021-07-05 DIAGNOSIS — Z1379 Encounter for other screening for genetic and chromosomal anomalies: Secondary | ICD-10-CM | POA: Diagnosis not present

## 2021-07-05 DIAGNOSIS — Z363 Encounter for antenatal screening for malformations: Secondary | ICD-10-CM

## 2021-07-05 DIAGNOSIS — Z3402 Encounter for supervision of normal first pregnancy, second trimester: Secondary | ICD-10-CM

## 2021-07-05 NOTE — Progress Notes (Signed)
LOW-RISK PREGNANCY VISIT Patient name: Carly Bates MRN [id number]  Date of birth: 2000/10/31 Chief Complaint:   Routine Prenatal Visit  History of Present Illness:   Carolynn Tuley is a 20 y.o. G46P0000 female at [redacted]w[redacted]d with an Estimated Date of Delivery: 12/15/21 being seen today for ongoing management of a low-risk pregnancy.   Today she reports no complaints. Contractions: Not present. Vag. Bleeding: None.  Movement: Absent. denies leaking of fluid.  Depression screen [facility] 2/9 06/14/2021 04/21/2020  Decreased Interest 2 0  Down, Depressed, Hopeless 0 0  PHQ - 2 Score 2 0  Altered sleeping 1 -  Tired, decreased energy 1 -  Change in appetite 1 -  Feeling bad or failure about yourself  0 -  Trouble concentrating 0 -  Moving slowly or fidgety/restless 0 -  Suicidal thoughts 0 -  PHQ-9 Score 5 -     GAD 7 : Generalized Anxiety Score 06/14/2021  Nervous, Anxious, on Edge 0  Control/stop worrying 1  Worry too much - different things 0  Trouble relaxing 0  Restless 0  Easily annoyed or irritable 1  Afraid - awful might happen 0  Total GAD 7 Score 2      Review of Systems:   Pertinent items are noted in HPI Denies abnormal vaginal discharge w/ itching/odor/irritation, headaches, visual changes, shortness of breath, chest pain, abdominal pain, severe nausea/vomiting, or problems with urination or bowel movements unless otherwise stated above. Pertinent History Reviewed:  Reviewed past medical,surgical, social, obstetrical and family history.  Reviewed problem list, medications and allergies. Physical Assessment:   Vitals:   07/05/21 1457  BP: 123/73  Pulse: 98  Weight: 166 lb (75.3 kg)  Body mass index is 29.41 kg/m.        Physical Examination:   General appearance: Well appearing, and in no distress  Mental status: Alert, oriented to person, place, and time  Skin: Warm & dry  Cardiovascular: Normal heart rate noted  Respiratory: Normal respiratory effort, no  distress  Abdomen: Soft, gravid, nontender  Pelvic: Cervical exam deferred         Extremities: Edema: None  Fetal Status: Fetal Heart Rate (bpm): 140   Movement: Absent    Chaperone: N/A   No results found for this or any previous visit (from the past 24 hour(s)).  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  Labs/procedures today: AFP  Plan:  Continue routine obstetrical care  Next visit: prefers will be in person for u/s     Reviewed: Preterm labor symptoms and general obstetric precautions including but not limited to vaginal bleeding, contractions, leaking of fluid and fetal movement were reviewed in detail with the patient.  All questions were answered. Does have home bp cuff. Office bp cuff given: not applicable. Check bp weekly, let us know if consistently >140 and/or >90.  Follow-up: Return for 2-3wks LROB w/ anatomy u/s; please get 2004 op note from [place].  Future Appointments  Date Time Provider Department Center  08/03/2021 11:15 AM [facility] [name] [facility] None  08/03/2021  1:50 PM Eure, Amaryllis Dyke, MD [facility]    Orders Placed This Encounter  Procedures   US OB Comp + 14 Wk   AFP, Serum, Open Spina Bifida  Cheral Marker CNM, [facility] 07/05/2021 4:15 PM

## 2021-07-05 NOTE — Patient Instructions (Signed)
Carly Bates, thank you for choosing our office today! We appreciate the opportunity to meet your healthcare needs. You may receive a short survey by mail, e-mail, or through [employer]. If you are happy with your care we would appreciate if you could take just a few minutes to complete the survey questions. We read all of your comments and take your feedback very seriously. Thank you again for choosing our office.  Center for [employer] Team at [facility] at [facility] ([address] [place], [place] [address]) Entrance C, located off of [address] Free 24/7 valet parking  Go to [email or web address] to register for FREE online childbirth classes  Call the office [phone]) or go to [facility] if: You begin to severe cramping Your water breaks.  Sometimes it is a big gush of fluid, sometimes it is just a trickle that keeps getting your panties wet or running down your legs You have vaginal bleeding.  It is normal to have a small amount of spotting if your cervix was checked.   [facility] Pediatricians/Family Doctors [facility]): [address] [name], [phone]           [facility]: [address], [phone]                [facility]): [address], [phone] (call to ask if accepting patients) [facility] Department: [address], [place], [phone]    [facility] Pediatricians/Family Doctors [facility]): [phone] [address], [phone] [facility]: [address] [place], [phone] [facility]: [address], [phone]  [facility] Doctors  [facility]): [phone] [facility]: [address] [phone]   [facility] Doctors [facility]: [address] [phone]  [facility] Doctors  [facility]: [phone], [phone]  Home Blood Pressure Monitoring for Patients   Your provider has recommended that you check your blood pressure (BP) at least once a week at home. If you do not have a blood pressure cuff at home, one will be provided for you. Contact your provider if you have not received your monitor within 1 week.   Helpful Tips for Accurate Home Blood Pressure Checks  Don't smoke, exercise, or drink caffeine 30 minutes before checking your BP Use the restroom before checking your BP (a full bladder can raise your pressure) Relax in a comfortable upright chair Feet on the ground Left arm resting comfortably on a flat surface at the level of your heart Legs uncrossed Back supported Sit quietly and don't talk Place the cuff on your bare arm Adjust snuggly, so that only two fingertips can fit between your skin and the top of the cuff Check 2 readings separated by at least one minute Keep a log of your BP readings For a visual, please reference this diagram: http://ccnc.care/bpdiagram  Provider Name: [facility]     Phone: [phone]  Zone 1: ALL CLEAR  Continue to monitor your symptoms:  BP reading is less than 140 (top number) or less than 90 (bottom number)  No right upper stomach pain No headaches or seeing spots No feeling nauseated or throwing up No swelling in face and hands  Zone 2: CAUTION Call your doctor's office for any of the following:  BP reading is greater than 140 (top number) or greater than  90 (bottom number)  Stomach pain under your ribs in the middle or right side Headaches or seeing spots Feeling nauseated or throwing up Swelling in face and hands  Zone 3: EMERGENCY  Seek immediate medical care if you have any of the following:  BP reading is greater than160 (top number) or greater than 110 (bottom number) Severe headaches not improving with Tylenol Serious difficulty catching your breath Any worsening symptoms from  Zone 2     Second Trimester of Pregnancy The second trimester is from week 14 through week 27 (months 4 through 6). The second trimester is often a time when you feel your best. Your body has adjusted to being pregnant, and you begin to feel better physically. Usually, morning sickness has lessened or quit completely, you may have more energy, and you may have an increase in appetite. The second trimester is also a time when the fetus is growing rapidly. At the end of the sixth month, the fetus is about 9 inches long and weighs about 1 pounds. You will likely begin to feel the baby move (quickening) between 16 and 20 weeks of pregnancy. Body changes during your second trimester Your body continues to go through many changes during your second trimester. The changes vary from woman to woman. Your weight will continue to increase. You will notice your lower abdomen bulging out. You may begin to get stretch marks on your hips, abdomen, and breasts. You may develop headaches that can be relieved by medicines. The medicines should be approved by your health care provider. You may urinate more often because the fetus is pressing on your bladder. You may develop or continue to have heartburn as a result of your pregnancy. You may develop constipation because certain hormones are causing the muscles that push waste through your intestines to slow down. You may develop hemorrhoids or swollen, bulging veins (varicose veins). You may have back pain. This is caused by: Weight gain. Pregnancy hormones that are relaxing the joints in your pelvis. A shift in weight and the muscles that support your balance. Your breasts will continue to grow and they will continue to become tender. Your gums may bleed and may be sensitive to brushing and flossing. Dark spots or blotches (chloasma, mask of pregnancy) may develop on your face. This will likely fade after the baby is born. A dark line from your belly button to  the pubic area (linea nigra) may appear. This will likely fade after the baby is born. You may have changes in your hair. These can include thickening of your hair, rapid growth, and changes in texture. Some women also have hair loss during or after pregnancy, or hair that feels dry or thin. Your hair will most likely return to normal after your baby is born.  What to expect at prenatal visits During a routine prenatal visit: You will be weighed to make sure you and the fetus are growing normally. Your blood pressure will be taken. Your abdomen will be measured to track your baby's growth. The fetal heartbeat will be listened to. Any test results from the previous visit will be discussed.  Your health care provider may ask you: How you are feeling. If you are feeling the baby move. If you have had any abnormal symptoms, such as leaking fluid, bleeding, severe headaches, or abdominal cramping. If you are using any tobacco products, including cigarettes, chewing tobacco, and electronic cigarettes. If you have any questions.  Other tests that may be performed during   your second trimester include: Blood tests that check for: Low iron levels (anemia). High blood sugar that affects pregnant women (gestational diabetes) between 24 and 28 weeks. Rh antibodies. This is to check for a protein on red blood cells (Rh factor). Urine tests to check for infections, diabetes, or protein in the urine. An ultrasound to confirm the proper growth and development of the baby. An amniocentesis to check for possible genetic problems. Fetal screens for spina bifida and Down syndrome. HIV (human immunodeficiency virus) testing. Routine prenatal testing includes screening for HIV, unless you choose not to have this test.  Follow these instructions at home: Medicines Follow your health care provider's instructions regarding medicine use. Specific medicines may be either safe or unsafe to take during  pregnancy. Take a prenatal vitamin that contains at least 600 micrograms (mcg) of folic acid. If you develop constipation, try taking a stool softener if your health care provider approves. Eating and drinking Eat a balanced diet that includes fresh fruits and vegetables, whole grains, good sources of protein such as meat, eggs, or tofu, and low-fat dairy. Your health care provider will help you determine the amount of weight gain that is right for you. Avoid raw meat and uncooked cheese. These carry germs that can cause birth defects in the baby. If you have low calcium intake from food, talk to your health care provider about whether you should take a daily calcium supplement. Limit foods that are high in fat and processed sugars, such as fried and sweet foods. To prevent constipation: Drink enough fluid to keep your urine clear or pale yellow. Eat foods that are high in fiber, such as fresh fruits and vegetables, whole grains, and beans. Activity Exercise only as directed by your health care provider. Most women can continue their usual exercise routine during pregnancy. Try to exercise for 30 minutes at least 5 days a week. Stop exercising if you experience uterine contractions. Avoid heavy lifting, wear low heel shoes, and practice good posture. A sexual relationship may be continued unless your health care provider directs you otherwise. Relieving pain and discomfort Wear a good support bra to prevent discomfort from breast tenderness. Take warm sitz baths to soothe any pain or discomfort caused by hemorrhoids. Use hemorrhoid cream if your health care provider approves. Rest with your legs elevated if you have leg cramps or low back pain. If you develop varicose veins, wear support hose. Elevate your feet for 15 minutes, 3-4 times a day. Limit salt in your diet. Prenatal Care Write down your questions. Take them to your prenatal visits. Keep all your prenatal visits as told by your health  care provider. This is important. Safety Wear your seat belt at all times when driving. Make a list of emergency phone numbers, including numbers for family, friends, the hospital, and police and fire departments. General instructions Ask your health care provider for a referral to a local prenatal education class. Begin classes no later than the beginning of month 6 of your pregnancy. Ask for help if you have counseling or nutritional needs during pregnancy. Your health care provider can offer advice or refer you to specialists for help with various needs. Do not use hot tubs, steam rooms, or saunas. Do not douche or use tampons or scented sanitary pads. Do not cross your legs for long periods of time. Avoid cat litter boxes and soil used by cats. These carry germs that can cause birth defects in the baby and possibly loss of the   fetus by miscarriage or stillbirth. Avoid all smoking, herbs, alcohol, and unprescribed drugs. Chemicals in these products can affect the formation and growth of the baby. Do not use any products that contain nicotine or tobacco, such as cigarettes and e-cigarettes. If you need help quitting, ask your health care provider. Visit your dentist if you have not gone yet during your pregnancy. Use a soft toothbrush to brush your teeth and be gentle when you floss. Contact a health care provider if: You have dizziness. You have mild pelvic cramps, pelvic pressure, or nagging pain in the abdominal area. You have persistent nausea, vomiting, or diarrhea. You have a bad smelling vaginal discharge. You have pain when you urinate. Get help right away if: You have a fever. You are leaking fluid from your vagina. You have spotting or bleeding from your vagina. You have severe abdominal cramping or pain. You have rapid weight gain or weight loss. You have shortness of breath with chest pain. You notice sudden or extreme swelling of your face, hands, ankles, feet, or legs. You  have not felt your baby move in over an hour. You have severe headaches that do not go away when you take medicine. You have vision changes. Summary The second trimester is from week 14 through week 27 (months 4 through 6). It is also a time when the fetus is growing rapidly. Your body goes through many changes during pregnancy. The changes vary from woman to woman. Avoid all smoking, herbs, alcohol, and unprescribed drugs. These chemicals affect the formation and growth your baby. Do not use any tobacco products, such as cigarettes, chewing tobacco, and e-cigarettes. If you need help quitting, ask your health care provider. Contact your health care provider if you have any questions. Keep all prenatal visits as told by your health care provider. This is important. This information is not intended to replace advice given to you by your health care provider. Make sure you discuss any questions you have with your health care provider. Document Released: 07/12/2001 Document Revised: 12/24/2015 Document Reviewed: 09/18/2012 Elsevier Interactive Patient Education  2017 [employer] Inc.  

## 2021-07-07 LAB — AFP, SERUM, OPEN SPINA BIFIDA
AFP MoM: 1.22
AFP Value: 47 ng/mL
Gest. Age on Collection Date: 16.5 weeks
Maternal Age At EDD: 20 yr
OSBR Risk 1 IN: 10000
Test Results:: NEGATIVE
Weight: 166 [lb_av]

## 2021-08-01 NOTE — L&D Delivery Note (Addendum)
Delivery Note ?Carly Bates is a 21 y.o. G1P0000 at [redacted]w[redacted]d admitted for early labor, IOL NRNST, GHTN.  ? ?GBS Status: --Henderson Cloud (04/19 1130) ?Maximum Maternal Temperature: 99.9 (right after delivery) ? ?Labor course: Initial SVE: 1/th/-3. Augmentation with: AROM, Cytotec, and IP Foley. She then progressed to complete.  ?ROM: 2230 with mod MSF, NICU called to be in attendance d/t this ? ?Birth: At 0119 a viable female was delivered via spontaneous vaginal delivery (Presentation: LOA). Nuchal cord present: No.  Shoulders and body delivered in usual fashion. Infant placed directly on mom's abdomen for bonding/skin-to-skin, baby dried and stimulated. Cord clamped x 2 after 1 minute and cut by FOB.  Cord blood collected.  Large gush of blood prior to placenta. The placenta separated spontaneously and delivered via gentle cord traction.  Pitocin infused rapidly IV per protocol.  Fundus boggy, LUS cleared of clots, cytotec 439mcg buccal and 446mcg rectal, fundus firms w/ massage, then boggy again w/ clots- so TXA given, firms briefly then boggy again- uterine sweep w/ some small clots, no placental fragments/membranes- methergine given, bladder emptied of 161ml urine. Firms briefly then boggy again- Dr. Damita Dunnings called in, LUS sweep w/ a few small possible placental fragments, fundus firm, minimal bleeding. Will obtain [place] labs and given Clinda IV x 1 (anaphylaxis to PCN-discussed w/ pharmacy). Will hold off on po methergine series d/t GHTN (discussed w/ Dr. Damita Dunnings) ?Placenta inspected and appears to be intact with a 3 VC.  Placenta/Cord with the following complications: meconium stained .  Cord pH: not done.  ?Sponge and instrument count were correct x2. ? ?Intrapartum complications:  None ?Anesthesia:  epidural ?Episiotomy: none ?Lacerations:  Rt labial/sulcal ?Suture Repair: 3.0 vicryl ?EBL (mL): 1017 ? ? ?Infant: ?APGAR (1 MIN): 9   ?APGAR (5 MINS): 9   ?APGAR (10 MINS):    ?Infant weight: pending ? ?Mom to  postpartum.  Baby to Couplet care / Skin to Skin. Placenta to L&D   ?Plans to Breastfeed ?Contraception:  undecided ?Circumcision: N/A ? ?Note sent to [facility]: FT for pp visit. ? ?Roma Schanz CNM, WHNP-BC ?12/15/2021 ?2:19 AM ? ?  ?

## 2021-08-03 ENCOUNTER — Other Ambulatory Visit: Payer: Medicaid Other

## 2021-08-03 ENCOUNTER — Encounter: Payer: Medicaid Other | Admitting: Obstetrics & Gynecology

## 2021-08-09 ENCOUNTER — Other Ambulatory Visit: Payer: Self-pay

## 2021-08-09 ENCOUNTER — Encounter: Payer: Self-pay | Admitting: Women's Health

## 2021-08-09 ENCOUNTER — Ambulatory Visit (INDEPENDENT_AMBULATORY_CARE_PROVIDER_SITE_OTHER): Payer: Medicaid Other | Admitting: Women's Health

## 2021-08-09 VITALS — BP 118/72 | HR 90 | Wt 165.6 lb

## 2021-08-09 DIAGNOSIS — Z3402 Encounter for supervision of normal first pregnancy, second trimester: Secondary | ICD-10-CM

## 2021-08-09 NOTE — Patient Instructions (Signed)
Carly Bates, thank you for choosing our office today! We appreciate the opportunity to meet your healthcare needs. You may receive a short survey by mail, e-mail, or through MyChart. If you are happy with your care we would appreciate if you could take just a few minutes to complete the survey questions. We read all of your comments and take your feedback very seriously. Thank you again for choosing our office.  [facility] Team at [facility] ([address] [place], [place] [address]) Entrance C, located off of [address] Free 24/7 valet parking  Go to [email or web address] to register for FREE online childbirth classes  Call the office ([phone]) or go to [facility] if: You begin to severe cramping Your water breaks.  Sometimes it is a big gush of fluid, sometimes it is just a trickle that keeps getting your panties wet or running down your legs You have vaginal bleeding.  It is normal to have a small amount of spotting if your cervix was checked.   [place] Pediatricians/Family Doctors [facility] ([facility]): [address], [phone]           [facility]: [address], [phone]                [facility] ([facility]): [address], [phone] (call to ask if accepting patients) [facility]: [address], [place], [phone]    [place] Pediatricians/Family Doctors [facility] ([facility]): [address], [phone] [facility]: [address], [phone] [facility]: [address], [phone]  [place] [facility]  [facility] ([facility]): [phone] [facility]: [address], [phone]   [place] [facility]: [address], [phone]  [place] [facility]  [facility]: [address], [phone]  Home Blood Pressure Monitoring for Patients   Your provider has recommended that you check your blood pressure (BP) at least once a week at home. If you do not have a blood pressure cuff at home, one will be provided for you. Contact your provider if you have not received your monitor within 1 week.   Helpful Tips for Accurate Home Blood Pressure Checks  Don't smoke, exercise, or drink caffeine 30 minutes before checking your BP Use the restroom before checking your BP (a full bladder can raise your pressure) Relax in a comfortable upright chair Feet on the ground Left arm resting comfortably on a flat surface at the level of your heart Legs uncrossed Back supported Sit quietly and don't talk Place the cuff on your bare arm Adjust snuggly, so that only two fingertips can fit between your skin and the top of the cuff Check 2 readings separated by at least one minute Keep a log of your BP readings For a visual, please reference this diagram: http://[facility].care/bpdiagram  Provider Name: [facility]     Phone: [phone]  Zone 1: ALL CLEAR  Continue to monitor your symptoms:  BP reading is less than 140 (top number) or less than 90 (bottom number)  No right upper stomach pain No headaches or seeing spots No feeling nauseated or throwing up No swelling in face and hands  Zone 2: CAUTION Call your doctor's office for any of the following:  BP reading is greater than 140 (top number) or greater than   90 (bottom number)  Stomach pain under your ribs in the middle or right side Headaches or seeing spots Feeling nauseated or throwing up Swelling in face and hands  Zone 3: EMERGENCY  Seek immediate medical care if you have any of the following:  BP reading is greater than160 (top number) or greater than 110 (bottom number) Severe headaches not improving with Tylenol Serious difficulty catching your breath Any worsening symptoms from  Zone 2     Second Trimester of Pregnancy The second trimester is from week 14 through week 27 (months 4 through 6). The second trimester is often a time when you feel your best. Your body has adjusted to being pregnant, and you begin to feel better physically. Usually, morning sickness has lessened or quit completely, you may have more energy, and you may have an increase in appetite. The second trimester is also a time when the fetus is growing rapidly. At the end of the sixth month, the fetus is about 9 inches long and weighs about 1 pounds. You will likely begin to feel the baby move (quickening) between 16 and 20 weeks of pregnancy. Body changes during your second trimester Your body continues to go through many changes during your second trimester. The changes vary from woman to woman. Your weight will continue to increase. You will notice your lower abdomen bulging out. You may begin to get stretch marks on your hips, abdomen, and breasts. You may develop headaches that can be relieved by medicines. The medicines should be approved by your health care provider. You may urinate more often because the fetus is pressing on your bladder. You may develop or continue to have heartburn as a result of your pregnancy. You may develop constipation because certain hormones are causing the muscles that push waste through your intestines to slow down. You may develop hemorrhoids or swollen, bulging veins (varicose veins). You may have back pain. This is caused by: Weight gain. Pregnancy hormones that are relaxing the joints in your pelvis. A shift in weight and the muscles that support your balance. Your breasts will continue to grow and they will continue to become tender. Your gums may bleed and may be sensitive to brushing and flossing. Dark spots or blotches (chloasma, mask of pregnancy) may develop on your face. This will likely fade after the baby is born. A dark line from your belly button to  the pubic area (linea nigra) may appear. This will likely fade after the baby is born. You may have changes in your hair. These can include thickening of your hair, rapid growth, and changes in texture. Some women also have hair loss during or after pregnancy, or hair that feels dry or thin. Your hair will most likely return to normal after your baby is born.  What to expect at prenatal visits During a routine prenatal visit: You will be weighed to make sure you and the fetus are growing normally. Your blood pressure will be taken. Your abdomen will be measured to track your baby's growth. The fetal heartbeat will be listened to. Any test results from the previous visit will be discussed.  Your health care provider may ask you: How you are feeling. If you are feeling the baby move. If you have had any abnormal symptoms, such as leaking fluid, bleeding, severe headaches, or abdominal cramping. If you are using any tobacco products, including cigarettes, chewing tobacco, and electronic cigarettes. If you have any questions.  Other tests that may be performed during   your second trimester include: Blood tests that check for: Low iron levels (anemia). High blood sugar that affects pregnant women (gestational diabetes) between 24 and 28 weeks. Rh antibodies. This is to check for a protein on red blood cells (Rh factor). Urine tests to check for infections, diabetes, or protein in the urine. An ultrasound to confirm the proper growth and development of the baby. An amniocentesis to check for possible genetic problems. Fetal screens for spina bifida and Down syndrome. HIV (human immunodeficiency virus) testing. Routine prenatal testing includes screening for HIV, unless you choose not to have this test.  Follow these instructions at home: Medicines Follow your health care provider's instructions regarding medicine use. Specific medicines may be either safe or unsafe to take during  pregnancy. Take a prenatal vitamin that contains at least 600 micrograms (mcg) of folic acid. If you develop constipation, try taking a stool softener if your health care provider approves. Eating and drinking Eat a balanced diet that includes fresh fruits and vegetables, whole grains, good sources of protein such as meat, eggs, or tofu, and low-fat dairy. Your health care provider will help you determine the amount of weight gain that is right for you. Avoid raw meat and uncooked cheese. These carry germs that can cause birth defects in the baby. If you have low calcium intake from food, talk to your health care provider about whether you should take a daily calcium supplement. Limit foods that are high in fat and processed sugars, such as fried and sweet foods. To prevent constipation: Drink enough fluid to keep your urine clear or pale yellow. Eat foods that are high in fiber, such as fresh fruits and vegetables, whole grains, and beans. Activity Exercise only as directed by your health care provider. Most women can continue their usual exercise routine during pregnancy. Try to exercise for 30 minutes at least 5 days a week. Stop exercising if you experience uterine contractions. Avoid heavy lifting, wear low heel shoes, and practice good posture. A sexual relationship may be continued unless your health care provider directs you otherwise. Relieving pain and discomfort Wear a good support bra to prevent discomfort from breast tenderness. Take warm sitz baths to soothe any pain or discomfort caused by hemorrhoids. Use hemorrhoid cream if your health care provider approves. Rest with your legs elevated if you have leg cramps or low back pain. If you develop varicose veins, wear support hose. Elevate your feet for 15 minutes, 3-4 times a day. Limit salt in your diet. Prenatal Care Write down your questions. Take them to your prenatal visits. Keep all your prenatal visits as told by your health  care provider. This is important. Safety Wear your seat belt at all times when driving. Make a list of emergency phone numbers, including numbers for family, friends, the hospital, and police and fire departments. General instructions Ask your health care provider for a referral to a local prenatal education class. Begin classes no later than the beginning of month 6 of your pregnancy. Ask for help if you have counseling or nutritional needs during pregnancy. Your health care provider can offer advice or refer you to specialists for help with various needs. Do not use hot tubs, steam rooms, or saunas. Do not douche or use tampons or scented sanitary pads. Do not cross your legs for long periods of time. Avoid cat litter boxes and soil used by cats. These carry germs that can cause birth defects in the baby and possibly loss of the   fetus by miscarriage or stillbirth. Avoid all smoking, herbs, alcohol, and unprescribed drugs. Chemicals in these products can affect the formation and growth of the baby. Do not use any products that contain nicotine or tobacco, such as cigarettes and e-cigarettes. If you need help quitting, ask your health care provider. Visit your dentist if you have not gone yet during your pregnancy. Use a soft toothbrush to brush your teeth and be gentle when you floss. Contact a health care provider if: You have dizziness. You have mild pelvic cramps, pelvic pressure, or nagging pain in the abdominal area. You have persistent nausea, vomiting, or diarrhea. You have a bad smelling vaginal discharge. You have pain when you urinate. Get help right away if: You have a fever. You are leaking fluid from your vagina. You have spotting or bleeding from your vagina. You have severe abdominal cramping or pain. You have rapid weight gain or weight loss. You have shortness of breath with chest pain. You notice sudden or extreme swelling of your face, hands, ankles, feet, or legs. You  have not felt your baby move in over an hour. You have severe headaches that do not go away when you take medicine. You have vision changes. Summary The second trimester is from week 14 through week 27 (months 4 through 6). It is also a time when the fetus is growing rapidly. Your body goes through many changes during pregnancy. The changes vary from woman to woman. Avoid all smoking, herbs, alcohol, and unprescribed drugs. These chemicals affect the formation and growth your baby. Do not use any tobacco products, such as cigarettes, chewing tobacco, and e-cigarettes. If you need help quitting, ask your health care provider. Contact your health care provider if you have any questions. Keep all prenatal visits as told by your health care provider. This is important. This information is not intended to replace advice given to you by your health care provider. Make sure you discuss any questions you have with your health care provider. Document Released: 07/12/2001 Document Revised: 12/24/2015 Document Reviewed: 09/18/2012 Elsevier Interactive Patient Education  2017 [employer]  

## 2021-08-09 NOTE — Progress Notes (Signed)
° ° °  LOW-RISK PREGNANCY VISIT Patient name: Carly Bates MRN [id number]  Date of birth: March 04, 2001 Chief Complaint:   Routine Prenatal Visit  History of Present Illness:   Carly Bates is a 21 y.o. G55P0000 female at [redacted]w[redacted]d with an Estimated Date of Delivery: 12/15/21 being seen today for ongoing management of a low-risk pregnancy.   Today she reports no complaints. Contractions: Not present. Vag. Bleeding: None.  Movement: Present. denies leaking of fluid.  Depression screen [facility] 2/9 06/14/2021 04/21/2020  Decreased Interest 2 0  Down, Depressed, Hopeless 0 0  PHQ - 2 Score 2 0  Altered sleeping 1 -  Tired, decreased energy 1 -  Change in appetite 1 -  Feeling bad or failure about yourself  0 -  Trouble concentrating 0 -  Moving slowly or fidgety/restless 0 -  Suicidal thoughts 0 -  PHQ-9 Score 5 -     GAD 7 : Generalized Anxiety Score 06/14/2021  Nervous, Anxious, on Edge 0  Control/stop worrying 1  Worry too much - different things 0  Trouble relaxing 0  Restless 0  Easily annoyed or irritable 1  Afraid - awful might happen 0  Total GAD 7 Score 2      Review of Systems:   Pertinent items are noted in HPI Denies abnormal vaginal discharge w/ itching/odor/irritation, headaches, visual changes, shortness of breath, chest pain, abdominal pain, severe nausea/vomiting, or problems with urination or bowel movements unless otherwise stated above. Pertinent History Reviewed:  Reviewed past medical,surgical, social, obstetrical and family history.  Reviewed problem list, medications and allergies. Physical Assessment:   Vitals:   08/09/21 1351  BP: 118/72  Pulse: 90  Weight: 165 lb 9.6 oz (75.1 kg)  Body mass index is 29.33 kg/m.        Physical Examination:   General appearance: Well appearing, and in no distress  Mental status: Alert, oriented to person, place, and time  Skin: Warm & dry  Cardiovascular: Normal heart rate noted  Respiratory: Normal respiratory  effort, no distress  Abdomen: Soft, gravid, nontender  Pelvic: Cervical exam deferred         Extremities: Edema: None  Fetal Status: Fetal Heart Rate (bpm): 153   Movement: Present    Chaperone: N/A   No results found for this or any previous visit (from the past 24 hour(s)).  Assessment & Plan:  1) Low-risk pregnancy G1P0000 at [redacted]w[redacted]d with an Estimated Date of Delivery: 12/15/21    Meds: No orders of the defined types were placed in this encounter.  Labs/procedures today: none  Plan:  Continue routine obstetrical care  Next visit: prefers will be in person for u/s     Reviewed: Preterm labor symptoms and general obstetric precautions including but not limited to vaginal bleeding, contractions, leaking of fluid and fetal movement were reviewed in detail with the patient.  All questions were answered. Does have home bp cuff. Office bp cuff given: not applicable. Check bp weekly, let us know if consistently >140 and/or >90.  Follow-up: Return in about 4 weeks (around 09/06/2021) for LROB, CNM, in person; as scheduled 1/18 for u/s; please print op note from [facility] for pt.  Future Appointments  Date Time Provider Department Center  08/18/2021 11:30 AM [facility] [name] [facility] None  08/18/2021  2:30 PM Cheral Marker, CNM [facility]    No orders of the defined types were placed in this encounter.  Cheral Marker CNM, [facility] 08/09/2021 2:26 PM

## 2021-08-18 ENCOUNTER — Encounter: Payer: Self-pay | Admitting: Women's Health

## 2021-08-18 ENCOUNTER — Ambulatory Visit (INDEPENDENT_AMBULATORY_CARE_PROVIDER_SITE_OTHER): Payer: Medicaid Other

## 2021-08-18 ENCOUNTER — Other Ambulatory Visit: Payer: Self-pay

## 2021-08-18 ENCOUNTER — Encounter: Payer: Medicaid Other | Admitting: Women's Health

## 2021-08-18 VITALS — BP 110/69 | HR 86 | Wt 166.5 lb

## 2021-08-18 DIAGNOSIS — Z3A23 23 weeks gestation of pregnancy: Secondary | ICD-10-CM | POA: Diagnosis not present

## 2021-08-18 DIAGNOSIS — Z363 Encounter for antenatal screening for malformations: Secondary | ICD-10-CM | POA: Diagnosis not present

## 2021-08-18 DIAGNOSIS — Z3402 Encounter for supervision of normal first pregnancy, second trimester: Secondary | ICD-10-CM | POA: Diagnosis not present

## 2021-08-18 NOTE — Progress Notes (Signed)
[name] 23 wks,cephalic,cx 3.3 cm,posterior placenta gr 0,svp of fluid 3.7 cm,fhr 152 bpm,normal ovaries,EFW 551 g 41%,limited view of NB,nasal bone seen on prior ultrasound,no obvious abnormalities

## 2021-08-18 NOTE — Progress Notes (Deleted)
° ° °  LOW-RISK PREGNANCY VISIT Patient name: Carly Bates MRN [id number]  Date of birth: 14-Mar-2001 Chief Complaint:   Routine Prenatal Visit ([name] today)  History of Present Illness:   Carly Bates is a 21 y.o. G1P0000 female at [redacted]w[redacted]d with an Estimated Date of Delivery: 12/15/21 being seen today for ongoing management of a low-risk pregnancy.   Today she reports {pregnancy symptoms:25616::"no complaints"}. Contractions: Not present. Vag. Bleeding: None.  Movement: Present. {Actions; denies-reports:120008::"denies"} leaking of fluid.  Depression screen [facility] 2/9 06/14/2021 04/21/2020  Decreased Interest 2 0  Down, Depressed, Hopeless 0 0  PHQ - 2 Score 2 0  Altered sleeping 1 -  Tired, decreased energy 1 -  Change in appetite 1 -  Feeling bad or failure about yourself  0 -  Trouble concentrating 0 -  Moving slowly or fidgety/restless 0 -  Suicidal thoughts 0 -  PHQ-9 Score 5 -     GAD 7 : Generalized Anxiety Score 06/14/2021  Nervous, Anxious, on Edge 0  Control/stop worrying 1  Worry too much - different things 0  Trouble relaxing 0  Restless 0  Easily annoyed or irritable 1  Afraid - awful might happen 0  Total GAD 7 Score 2      Review of Systems:   Pertinent items are noted in HPI Denies abnormal vaginal discharge w/ itching/odor/irritation, headaches, visual changes, shortness of breath, chest pain, abdominal pain, severe nausea/vomiting, or problems with urination or bowel movements unless otherwise stated above. Pertinent History Reviewed:  Reviewed past medical,surgical, social, obstetrical and family history.  Reviewed problem list, medications and allergies. Physical Assessment:   Vitals:   08/18/21 1449  BP: 110/69  Pulse: 86  Weight: 166 lb 8 oz (75.5 kg)  Body mass index is 29.49 kg/m.        Physical Examination:   General appearance: Well appearing, and in no distress  Mental status: Alert, oriented to person, place, and time  Skin: Warm &  dry  Cardiovascular: Normal heart rate noted  Respiratory: Normal respiratory effort, no distress  Abdomen: Soft, gravid, nontender  Pelvic: {Blank single:19197::"Cervical exam performed","Cervical exam deferred"}         Extremities: Edema: None  Fetal Status:     Movement: Present    Chaperone: {Chaperone:19197::"N/A","pt declined","Latisha Cresenzo","Janet Young","Amanda Andrews","Peggy Dones","Angel Neas"}   No results found for this or any previous visit (from the past 24 hour(s)).  Assessment & Plan:  1) Low-risk pregnancy G1P0000 at [redacted]w[redacted]d with an Estimated Date of Delivery: 12/15/21   2) ***, ***   Meds: No orders of the defined types were placed in this encounter.  Labs/procedures today: {ob lab/procedures:25214}  Plan:  Continue routine obstetrical care *** Next visit: prefers {Blank single:19197::"in person","online","will be in person for ***"}    Reviewed: {Blank single:19197::"Term","Preterm"} labor symptoms and general obstetric precautions including but not limited to vaginal bleeding, contractions, leaking of fluid and fetal movement were reviewed in detail with the patient.  All questions were answered. {does does not:25387::"Does"} have home bp cuff. Office bp cuff given: {yes/no/default n/a:21102::"not applicable"}. Check bp {weekly daily:25388::"weekly"}, let us know if consistently {pregnant bp:25389::">140 and/or >90"}.  Follow-up: No follow-ups on file.  No future appointments.  No orders of the defined types were placed in this encounter.  Cheral Marker CNM, [facility] 08/18/2021 2:55 PM

## 2021-08-19 NOTE — Progress Notes (Signed)
This encounter was created in error - please disregard.

## 2021-09-06 ENCOUNTER — Ambulatory Visit (INDEPENDENT_AMBULATORY_CARE_PROVIDER_SITE_OTHER): Payer: Medicaid Other | Admitting: Women's Health

## 2021-09-06 ENCOUNTER — Other Ambulatory Visit: Payer: Self-pay

## 2021-09-06 ENCOUNTER — Encounter: Payer: Self-pay | Admitting: Women's Health

## 2021-09-06 VITALS — BP 110/71 | HR 89 | Wt 164.5 lb

## 2021-09-06 DIAGNOSIS — Z3402 Encounter for supervision of normal first pregnancy, second trimester: Secondary | ICD-10-CM

## 2021-09-06 MED ORDER — ONDANSETRON HCL 4 MG PO TABS: 4.0000 mg | ORAL_TABLET | Freq: Three times a day (TID) | ORAL | 6 refills | Status: DC | PRN

## 2021-09-06 NOTE — Patient Instructions (Signed)
Carly Bates, thank you for choosing our office today! We appreciate the opportunity to meet your healthcare needs. You may receive a short survey by mail, e-mail, or through [employer]. If you are happy with your care we would appreciate if you could take just a few minutes to complete the survey questions. We read all of your comments and take your feedback very seriously. Thank you again for choosing our office.  Center for [employer] Team at [place] at [facility] ([place], [place] [address]) Entrance C, located off of [place] parking   You will have your sugar test next visit.  Please do not eat or drink anything after midnight the night before you come, not even water.  You will be here for at least two hours.  Please make an appointment online for the bloodwork at [email or web address] for 8:00am (or as close to this as possible). Make sure you select the [facility] service center.   CLASSES: Go to [email or web address] to register for classes (childbirth, breastfeeding, waterbirth, infant CPR, daddy bootcamp, etc.)  Call the office [phone]) or go to [facility] if: You begin to have strong, frequent contractions Your water breaks.  Sometimes it is a big gush of fluid, sometimes it is just a trickle that keeps getting your panties wet or running down your legs You have vaginal bleeding.  It is normal to have a small amount of spotting if your cervix was checked.  You don't feel your baby moving like normal.  If you don't, get you something to eat and drink and lay down and focus on feeling your baby move.   If your baby is still not moving like normal, you should call the office or go to [facility].  Call the office [phone]) or go to [facility] for these signs of pre-eclampsia: Severe headache that does not go away with Tylenol Visual changes- seeing spots, double, blurred vision Pain under your right breast or  upper abdomen that does not go away with Tums or heartburn medicine Nausea and/or vomiting Severe swelling in your hands, feet, and face    [facility] Pediatricians/Family Doctors [facility]): [address] [place], [place]: [address] [place], [facility]): [place], [phone] (call to ask if accepting patients) [facility] Department: [address], [place], [facility]): [address], [facility]: [address] [place], [place] [facility]: [place], [facility]): [phone] [facility]: [address] [place]: [address] [facility]: [phone], [phone]  Home Blood Pressure Monitoring for Patients   Your provider has recommended that you check your blood pressure (BP) at least once a week at home. If you do not have a blood pressure cuff at home, one will be provided for you. Contact your provider if you have not received your monitor within 1 week.   Helpful Tips for Accurate Home Blood Pressure Checks  Don't smoke, exercise, or drink  caffeine 30 minutes before checking your BP °Use the restroom before checking your BP (a full bladder can raise your pressure) °Relax in a comfortable upright chair °Feet on the ground °Left arm resting comfortably on a flat surface at the level of your heart °Legs uncrossed °Back supported °Sit quietly and don't talk °Place the cuff on your bare arm °Adjust snuggly, so that only two fingertips can fit between your skin and the top of the cuff °Check 2  readings separated by at least one minute °Keep a log of your BP readings °For a visual, please reference this diagram: http://ccnc.care/bpdiagram ° °Provider Name: [facility]     Phone: [phone] ° °Zone 1: ALL CLEAR  °Continue to monitor your symptoms:  °BP reading is less than 140 (top number) or less than 90 (bottom number)  °No right upper stomach pain °No headaches or seeing spots °No feeling nauseated or throwing up °No swelling in face and hands ° °Zone 2: CAUTION °Call your doctor's office for any of the following:  °BP reading is greater than 140 (top number) or greater than 90 (bottom number)  °Stomach pain under your ribs in the middle or right side °Headaches or seeing spots °Feeling nauseated or throwing up °Swelling in face and hands ° °Zone 3: EMERGENCY  °Seek immediate medical care if you have any of the following:  °BP reading is greater than160 (top number) or greater than 110 (bottom number) °Severe headaches not improving with Tylenol °Serious difficulty catching your breath °Any worsening symptoms from Zone 2  ° °Second Trimester of Pregnancy °The second trimester is from week 13 through week 28, months 4 through 6. The second trimester is often a time when you feel your best. Your body has also adjusted to being pregnant, and you begin to feel better physically. Usually, morning sickness has lessened or quit completely, you may have more energy, and you may have an increase in appetite. The second trimester is also a time when the fetus is growing rapidly. At the end of the sixth month, the fetus is about 9 inches long and weighs about 1½ pounds. You will likely begin to feel the baby move (quickening) between 18 and 20 weeks of the pregnancy. °BODY CHANGES °Your body goes through many changes during pregnancy. The changes vary from woman to woman.  °Your weight will continue to increase. You will notice your lower abdomen bulging out. °You may begin to get stretch marks on your  hips, abdomen, and breasts. °You may develop headaches that can be relieved by medicines approved by your health care provider. °You may urinate more often because the fetus is pressing on your bladder. °You may develop or continue to have heartburn as a result of your pregnancy. °You may develop constipation because certain hormones are causing the muscles that push waste through your intestines to slow down. °You may develop hemorrhoids or swollen, bulging veins (varicose veins). °You may have back pain because of the weight gain and pregnancy hormones relaxing your joints between the bones in your pelvis and as a result of a shift in weight and the muscles that support your balance. °Your breasts will continue to grow and be tender. °Your gums may bleed and may be sensitive to brushing and flossing. °Dark spots or blotches (chloasma, mask of pregnancy) may develop on your face. This will likely fade after the baby is born. °A dark line from your belly button to the pubic area (linea nigra) may appear. This   will likely fade after the baby is born. You may have changes in your hair. These can include thickening of your hair, rapid growth, and changes in texture. Some women also have hair loss during or after pregnancy, or hair that feels dry or thin. Your hair will most likely return to normal after your baby is born. WHAT TO EXPECT AT YOUR PRENATAL VISITS During a routine prenatal visit: You will be weighed to make sure you and the fetus are growing normally. Your blood pressure will be taken. Your abdomen will be measured to track your baby's growth. The fetal heartbeat will be listened to. Any test results from the previous visit will be discussed. Your health care provider may ask you: How you are feeling. If you are feeling the baby move. If you have had any abnormal symptoms, such as leaking fluid, bleeding, severe headaches, or abdominal cramping. If you have any questions. Other tests that may  be performed during your second trimester include: Blood tests that check for: Low iron levels (anemia). Gestational diabetes (between 24 and 28 weeks). Rh antibodies. Urine tests to check for infections, diabetes, or protein in the urine. An ultrasound to confirm the proper growth and development of the baby. An amniocentesis to check for possible genetic problems. Fetal screens for spina bifida and Down syndrome. HOME CARE INSTRUCTIONS  Avoid all smoking, herbs, alcohol, and unprescribed drugs. These chemicals affect the formation and growth of the baby. Follow your health care provider's instructions regarding medicine use. There are medicines that are either safe or unsafe to take during pregnancy. Exercise only as directed by your health care provider. Experiencing uterine cramps is a good sign to stop exercising. Continue to eat regular, healthy meals. Wear a good support bra for breast tenderness. Do not use hot tubs, steam rooms, or saunas. Wear your seat belt at all times when driving. Avoid raw meat, uncooked cheese, cat litter boxes, and soil used by cats. These carry germs that can cause birth defects in the baby. Take your prenatal vitamins. Try taking a stool softener (if your health care provider approves) if you develop constipation. Eat more high-fiber foods, such as fresh vegetables or fruit and whole grains. Drink plenty of fluids to keep your urine clear or pale yellow. Take warm sitz baths to soothe any pain or discomfort caused by hemorrhoids. Use hemorrhoid cream if your health care provider approves. If you develop varicose veins, wear support hose. Elevate your feet for 15 minutes, 3-4 times a day. Limit salt in your diet. Avoid heavy lifting, wear low heel shoes, and practice good posture. Rest with your legs elevated if you have leg cramps or low back pain. Visit your dentist if you have not gone yet during your pregnancy. Use a soft toothbrush to brush your teeth  and be gentle when you floss. A sexual relationship may be continued unless your health care provider directs you otherwise. Continue to go to all your prenatal visits as directed by your health care provider. SEEK MEDICAL CARE IF:  You have dizziness. You have mild pelvic cramps, pelvic pressure, or nagging pain in the abdominal area. You have persistent nausea, vomiting, or diarrhea. You have a bad smelling vaginal discharge. You have pain with urination. SEEK IMMEDIATE MEDICAL CARE IF:  You have a fever. You are leaking fluid from your vagina. You have spotting or bleeding from your vagina. You have severe abdominal cramping or pain. You have rapid weight gain or loss. You have shortness of  breath with chest pain. °You notice sudden or extreme swelling of your face, hands, ankles, feet, or legs. °You have not felt your baby move in over an hour. °You have severe headaches that do not go away with medicine. °You have vision changes. °Document Released: 07/12/2001 Document Revised: 07/23/2013 Document Reviewed: 09/18/2012 °ExitCare® Patient Information ©2015 [employer]. This information is not intended to replace advice given to you by your health care provider. Make sure you discuss any questions you have with your health care provider. ° °

## 2021-09-06 NOTE — Progress Notes (Signed)
LOW-RISK PREGNANCY VISIT Patient name: Carly Bates MRN [id number]  Date of birth: Jul 13, 2001 Chief Complaint:   Routine Prenatal Visit (Wants a different nausea med; Diclegis makes her vomit)  History of Present Illness:   Carly Bates is a 21 y.o. G76P0000 female at [redacted]w[redacted]d with an Estimated Date of Delivery: 12/15/21 being seen today for ongoing management of a low-risk pregnancy.   Today she reports  diclegis is making n/v worse, wants zofran . Contractions: Not present. Vag. Bleeding: None.  Movement: Present. denies leaking of fluid.  Depression screen [facility] 2/9 06/14/2021 04/21/2020  Decreased Interest 2 0  Down, Depressed, Hopeless 0 0  PHQ - 2 Score 2 0  Altered sleeping 1 -  Tired, decreased energy 1 -  Change in appetite 1 -  Feeling bad or failure about yourself  0 -  Trouble concentrating 0 -  Moving slowly or fidgety/restless 0 -  Suicidal thoughts 0 -  PHQ-9 Score 5 -     GAD 7 : Generalized Anxiety Score 06/14/2021  Nervous, Anxious, on Edge 0  Control/stop worrying 1  Worry too much - different things 0  Trouble relaxing 0  Restless 0  Easily annoyed or irritable 1  Afraid - awful might happen 0  Total GAD 7 Score 2      Review of Systems:   Pertinent items are noted in HPI Denies abnormal vaginal discharge w/ itching/odor/irritation, headaches, visual changes, shortness of breath, chest pain, abdominal pain, severe nausea/vomiting, or problems with urination or bowel movements unless otherwise stated above. Pertinent History Reviewed:  Reviewed past medical,surgical, social, obstetrical and family history.  Reviewed problem list, medications and allergies. Physical Assessment:   Vitals:   09/06/21 1443  BP: 110/71  Pulse: 89  Weight: 164 lb 8 oz (74.6 kg)  Body mass index is 29.14 kg/m.        Physical Examination:   General appearance: Well appearing, and in no distress  Mental status: Alert, oriented to person, place, and time  Skin: Warm  & dry  Cardiovascular: Normal heart rate noted  Respiratory: Normal respiratory effort, no distress  Abdomen: Soft, gravid, nontender  Pelvic: Cervical exam deferred         Extremities: Edema: None  Fetal Status: Fetal Heart Rate (bpm): 148 Fundal Height: 25 cm Movement: Present    Chaperone: N/A   No results found for this or any previous visit (from the past 24 hour(s)).  Assessment & Plan:  1) Low-risk pregnancy G1P0000 at [redacted]w[redacted]d with an Estimated Date of Delivery: 12/15/21   2) N/V, rx zofran   Meds:  Meds ordered this encounter  Medications   ondansetron (ZOFRAN) 4 MG tablet    Sig: Take 1 tablet (4 mg total) by mouth every 8 (eight) hours as needed for nausea or vomiting.    Dispense:  20 tablet    Refill:  6    Order Specific Question:   Supervising Provider    Answer:   Lazaro Arms [[id number]]   Labs/procedures today: none  Plan:  Continue routine obstetrical care  Next visit: prefers will be in person for pn2     Reviewed: Preterm labor symptoms and general obstetric precautions including but not limited to vaginal bleeding, contractions, leaking of fluid and fetal movement were reviewed in detail with the patient.  All questions were answered. Does have home bp cuff. Office bp cuff given: not applicable. Check bp weekly, let us know if consistently >140 and/or >90.  Follow-up:  Return in about 3 weeks (around 09/27/2021) for LROB, PN2, CNM, in person.  Future Appointments  Date Time Provider Department Center  09/27/2021  9:00 AM [facility]  09/27/2021  9:30 AM Cheral Marker, CNM [facility]    No orders of the defined types were placed in this encounter.  Cheral Marker CNM, [facility] 09/06/2021 3:28 PM

## 2021-09-27 ENCOUNTER — Other Ambulatory Visit: Payer: Medicaid Other

## 2021-09-27 ENCOUNTER — Other Ambulatory Visit: Payer: Self-pay

## 2021-09-27 ENCOUNTER — Ambulatory Visit (INDEPENDENT_AMBULATORY_CARE_PROVIDER_SITE_OTHER): Payer: Medicaid Other | Admitting: Women's Health

## 2021-09-27 ENCOUNTER — Encounter: Payer: Self-pay | Admitting: Women's Health

## 2021-09-27 VITALS — BP 115/74 | HR 93 | Wt 163.0 lb

## 2021-09-27 DIAGNOSIS — Z131 Encounter for screening for diabetes mellitus: Secondary | ICD-10-CM | POA: Diagnosis not present

## 2021-09-27 DIAGNOSIS — Z3403 Encounter for supervision of normal first pregnancy, third trimester: Secondary | ICD-10-CM

## 2021-09-27 DIAGNOSIS — Z3A28 28 weeks gestation of pregnancy: Secondary | ICD-10-CM

## 2021-09-27 DIAGNOSIS — Z23 Encounter for immunization: Secondary | ICD-10-CM

## 2021-09-27 NOTE — Progress Notes (Signed)
° ° °  LOW-RISK PREGNANCY VISIT Patient name: Carly Bates MRN [id number]  Date of birth: March 25, 2001 Chief Complaint:   Routine Prenatal Visit (PN2 today)  History of Present Illness:   Carly Bates is a 21 y.o. G62P0000 female at [redacted]w[redacted]d with an Estimated Date of Delivery: 12/15/21 being seen today for ongoing management of a low-risk pregnancy.   Today she reports no complaints. Contractions: Not present. Vag. Bleeding: None.  Movement: Present. denies leaking of fluid.  Depression screen [facility] 2/9 09/27/2021 06/14/2021 04/21/2020  Decreased Interest 2 2 0  Down, Depressed, Hopeless 1 0 0  PHQ - 2 Score 3 2 0  Altered sleeping 1 1 -  Tired, decreased energy 2 1 -  Change in appetite 3 1 -  Feeling bad or failure about yourself  1 0 -  Trouble concentrating 2 0 -  Moving slowly or fidgety/restless 0 0 -  Suicidal thoughts 0 0 -  PHQ-9 Score 12 5 -     GAD 7 : Generalized Anxiety Score 09/27/2021 06/14/2021  Nervous, Anxious, on Edge 1 0  Control/stop worrying 1 1  Worry too much - different things 1 0  Trouble relaxing 1 0  Restless 0 0  Easily annoyed or irritable 1 1  Afraid - awful might happen 0 0  Total GAD 7 Score 5 2      Review of Systems:   Pertinent items are noted in HPI Denies abnormal vaginal discharge w/ itching/odor/irritation, headaches, visual changes, shortness of breath, chest pain, abdominal pain, severe nausea/vomiting, or problems with urination or bowel movements unless otherwise stated above. Pertinent History Reviewed:  Reviewed past medical,surgical, social, obstetrical and family history.  Reviewed problem list, medications and allergies. Physical Assessment:   Vitals:   09/27/21 1006  BP: 115/74  Pulse: 93  Weight: 163 lb (73.9 kg)  Body mass index is 28.87 kg/m.        Physical Examination:   General appearance: Well appearing, and in no distress  Mental status: Alert, oriented to person, place, and time  Skin: Warm &  dry  Cardiovascular: Normal heart rate noted  Respiratory: Normal respiratory effort, no distress  Abdomen: Soft, gravid, nontender  Pelvic: Cervical exam deferred         Extremities: Edema: None  Fetal Status: Fetal Heart Rate (bpm): 150 Fundal Height: 27 cm Movement: Present    Chaperone: N/A   No results found for this or any previous visit (from the past 24 hour(s)).  Assessment & Plan:  1) Low-risk pregnancy G1P0000 at [redacted]w[redacted]d with an Estimated Date of Delivery: 12/15/21    Meds: No orders of the defined types were placed in this encounter.  Labs/procedures today: tdap and PN2  Plan:  Continue routine obstetrical care  Next visit: prefers in person    Reviewed: Preterm labor symptoms and general obstetric precautions including but not limited to vaginal bleeding, contractions, leaking of fluid and fetal movement were reviewed in detail with the patient.  All questions were answered. Does have home bp cuff. Office bp cuff given: not applicable. Check bp weekly, let us know if consistently >140 and/or >90.  Follow-up: Return in about 4 weeks (around 10/25/2021) for LROB, CNM, in person.  No future appointments.  Orders Placed This Encounter  Procedures   Tdap vaccine greater than or equal to 7yo IM   Cheral Marker CNM, [facility] 09/27/2021 10:36 AM

## 2021-09-27 NOTE — Patient Instructions (Signed)
Edson Snowball, thank you for choosing our office today! We appreciate the opportunity to meet your healthcare needs. You may receive a short survey by mail, e-mail, or through [employer]. If you are happy with your care we would appreciate if you could take just a few minutes to complete the survey questions. We read all of your comments and take your feedback very seriously. Thank you again for choosing our office.  Center for [employer] Team at [facility] ([address] [place], [place] [address]) Entrance C, located off of [address] Free 24/7 valet parking   CLASSES: Go to [email or web address] to register for classes (childbirth, breastfeeding, waterbirth, infant CPR, daddy bootcamp, etc.)  Call the office [phone]) or go to [facility] if: You begin to have strong, frequent contractions Your water breaks.  Sometimes it is a big gush of fluid, sometimes it is just a trickle that keeps getting your panties wet or running down your legs You have vaginal bleeding.  It is normal to have a small amount of spotting if your cervix was checked.  You don't feel your baby moving like normal.  If you don't, get you something to eat and drink and lay down and focus on feeling your baby move.   If your baby is still not moving like normal, you should call the office or go to [facility].  Call the office [phone]) or go to [facility] hospital for these signs of pre-eclampsia: Severe headache that does not go away with Tylenol Visual changes- seeing spots, double, blurred vision Pain under your right breast or upper abdomen that does not go away with Tums or heartburn medicine Nausea and/or vomiting Severe swelling in your hands, feet, and face   Tdap Vaccine It is recommended that you get the Tdap vaccine during the third trimester of EACH pregnancy to help protect your baby from getting pertussis (whooping cough) 27-36 weeks is the BEST time to do  this so that you can pass the protection on to your baby. During pregnancy is better than after pregnancy, but if you are unable to get it during pregnancy it will be offered at the hospital.  You can get this vaccine with [name], at the health department, your family doctor, or some local pharmacies Everyone who will be around your baby should also be up-to-date on their vaccines before the baby comes. Adults (who are not pregnant) only need 1 dose of Tdap during adulthood.   [facility] Pediatricians/Family Doctors [facility]): [address] [name], [phone]           [facility]: [address], [phone]                [facility]): [address], [phone] (call to ask if accepting patients) [facility] Department: [address], [place], [phone]    [facility] Pediatricians/Family Doctors [facility]): [phone], [phone] [facility]: [address] [place], [phone] [facility] [place]: [address], [phone]  [facility] Doctors  [facility]): [phone] [facility]: [address], [phone]   [facility] Doctors [facility]: [address], [phone]  [facility] Family Doctors  [facility]: [phone], [phone]  Home Blood Pressure Monitoring for Patients   Your provider has recommended that you check your  blood pressure (BP) at least once a week at home. If you do not have a blood pressure cuff at home, one will be provided for you. Contact your provider if you have not received your monitor within 1 week.   Helpful Tips for Accurate Home Blood Pressure Checks  Don't smoke, exercise, or drink caffeine 30 minutes before checking your BP Use the restroom before checking your BP (a full bladder can raise your  pressure) Relax in a comfortable upright chair Feet on the ground Left arm resting comfortably on a flat surface at the level of your heart Legs uncrossed Back supported Sit quietly and don't talk Place the cuff on your bare arm Adjust snuggly, so that only two fingertips can fit between your skin and the top of the cuff Check 2 readings separated by at least one minute Keep a log of your BP readings For a visual, please reference this diagram: http://ccnc.care/bpdiagram  Provider Name: [facility]     Phone: [phone]  Zone 1: ALL CLEAR  Continue to monitor your symptoms:  BP reading is less than 140 (top number) or less than 90 (bottom number)  No right upper stomach pain No headaches or seeing spots No feeling nauseated or throwing up No swelling in face and hands  Zone 2: CAUTION Call your doctor's office for any of the following:  BP reading is greater than 140 (top number) or greater than 90 (bottom number)  Stomach pain under your ribs in the middle or right side Headaches or seeing spots Feeling nauseated or throwing up Swelling in face and hands  Zone 3: EMERGENCY  Seek immediate medical care if you have any of the following:  BP reading is greater than160 (top number) or greater than 110 (bottom number) Severe headaches not improving with Tylenol Serious difficulty catching your breath Any worsening symptoms from Zone 2   Third Trimester of Pregnancy The third trimester is from week 29 through week 42, months 7 through 9. The third trimester is a time when the fetus is growing rapidly. At the end of the ninth month, the fetus is about 20 inches in length and weighs 6-10 pounds.  BODY CHANGES Your body goes through many changes during pregnancy. The changes vary from woman to woman.  Your weight will continue to increase. You can expect to gain 25-35 pounds (11-16 kg) by the end of the pregnancy. You may begin to get stretch marks on your hips, abdomen,  and breasts. You may urinate more often because the fetus is moving lower into your pelvis and pressing on your bladder. You may develop or continue to have heartburn as a result of your pregnancy. You may develop constipation because certain hormones are causing the muscles that push waste through your intestines to slow down. You may develop hemorrhoids or swollen, bulging veins (varicose veins). You may have pelvic pain because of the weight gain and pregnancy hormones relaxing your joints between the bones in your pelvis. Backaches may result from overexertion of the muscles supporting your posture. You may have changes in your hair. These can include thickening of your hair, rapid growth, and changes in texture. Some women also have hair loss during or after pregnancy, or hair that feels dry or thin. Your hair will most likely return to normal after your baby is born. Your breasts will continue to grow and be tender. A yellow discharge may leak from your breasts called colostrum. Your belly button may stick out. You may   feel short of breath because of your expanding uterus. You may notice the fetus "dropping," or moving lower in your abdomen. You may have a bloody mucus discharge. This usually occurs a few days to a week before labor begins. Your cervix becomes thin and soft (effaced) near your due date. WHAT TO EXPECT AT YOUR PRENATAL EXAMS  You will have prenatal exams every 2 weeks until week 36. Then, you will have weekly prenatal exams. During a routine prenatal visit: You will be weighed to make sure you and the fetus are growing normally. Your blood pressure is taken. Your abdomen will be measured to track your baby's growth. The fetal heartbeat will be listened to. Any test results from the previous visit will be discussed. You may have a cervical check near your due date to see if you have effaced. At around 36 weeks, your caregiver will check your cervix. At the same time, your  caregiver will also perform a test on the secretions of the vaginal tissue. This test is to determine if a type of bacteria, Group B streptococcus, is present. Your caregiver will explain this further. Your caregiver may ask you: What your birth plan is. How you are feeling. If you are feeling the baby move. If you have had any abnormal symptoms, such as leaking fluid, bleeding, severe headaches, or abdominal cramping. If you have any questions. Other tests or screenings that may be performed during your third trimester include: Blood tests that check for low iron levels (anemia). Fetal testing to check the health, activity level, and growth of the fetus. Testing is done if you have certain medical conditions or if there are problems during the pregnancy. FALSE LABOR You may feel small, irregular contractions that eventually go away. These are called Braxton Hicks contractions, or false labor. Contractions may last for hours, days, or even weeks before true labor sets in. If contractions come at regular intervals, intensify, or become painful, it is best to be seen by your caregiver.  SIGNS OF LABOR  Menstrual-like cramps. Contractions that are 5 minutes apart or less. Contractions that start on the top of the uterus and spread down to the lower abdomen and back. A sense of increased pelvic pressure or back pain. A watery or bloody mucus discharge that comes from the vagina. If you have any of these signs before the 37th week of pregnancy, call your caregiver right away. You need to go to the hospital to get checked immediately. HOME CARE INSTRUCTIONS  Avoid all smoking, herbs, alcohol, and unprescribed drugs. These chemicals affect the formation and growth of the baby. Follow your caregiver's instructions regarding medicine use. There are medicines that are either safe or unsafe to take during pregnancy. Exercise only as directed by your caregiver. Experiencing uterine cramps is a good sign to  stop exercising. Continue to eat regular, healthy meals. Wear a good support bra for breast tenderness. Do not use hot tubs, steam rooms, or saunas. Wear your seat belt at all times when driving. Avoid raw meat, uncooked cheese, cat litter boxes, and soil used by cats. These carry germs that can cause birth defects in the baby. Take your prenatal vitamins. Try taking a stool softener (if your caregiver approves) if you develop constipation. Eat more high-fiber foods, such as fresh vegetables or fruit and whole grains. Drink plenty of fluids to keep your urine clear or pale yellow. Take warm sitz baths to soothe any pain or discomfort caused by hemorrhoids. Use hemorrhoid cream if  your caregiver approves. If you develop varicose veins, wear support hose. Elevate your feet for 15 minutes, 3-4 times a day. Limit salt in your diet. Avoid heavy lifting, wear low heal shoes, and practice good posture. Rest a lot with your legs elevated if you have leg cramps or low back pain. Visit your dentist if you have not gone during your pregnancy. Use a soft toothbrush to brush your teeth and be gentle when you floss. A sexual relationship may be continued unless your caregiver directs you otherwise. Do not travel far distances unless it is absolutely necessary and only with the approval of your caregiver. Take prenatal classes to understand, practice, and ask questions about the labor and delivery. Make a trial run to the hospital. Pack your hospital bag. Prepare the baby's nursery. Continue to go to all your prenatal visits as directed by your caregiver. SEEK MEDICAL CARE IF: You are unsure if you are in labor or if your water has broken. You have dizziness. You have mild pelvic cramps, pelvic pressure, or nagging pain in your abdominal area. You have persistent nausea, vomiting, or diarrhea. You have a bad smelling vaginal discharge. You have pain with urination. SEEK IMMEDIATE MEDICAL CARE IF:  You  have a fever. You are leaking fluid from your vagina. You have spotting or bleeding from your vagina. You have severe abdominal cramping or pain. You have rapid weight loss or gain. You have shortness of breath with chest pain. You notice sudden or extreme swelling of your face, hands, ankles, feet, or legs. You have not felt your baby move in over an hour. You have severe headaches that do not go away with medicine. You have vision changes. Document Released: 07/12/2001 Document Revised: 07/23/2013 Document Reviewed: 09/18/2012 [facility] Patient Information 2015 [place], [place]. This information is not intended to replace advice given to you by your health care provider. Make sure you discuss any questions you have with your health care provider.

## 2021-09-28 LAB — CBC
Hematocrit: 33 % — ABNORMAL LOW (ref 34.0–46.6)
Hemoglobin: 11.2 g/dL (ref 11.1–15.9)
MCH: 29.9 pg (ref 26.6–33.0)
MCHC: 33.9 g/dL (ref 31.5–35.7)
MCV: 88 fL (ref 79–97)
Platelets: 377 10*3/uL (ref 150–450)
RBC: 3.75 x10E6/uL — ABNORMAL LOW (ref 3.77–5.28)
RDW: 12.8 % (ref 11.7–15.4)
WBC: 9.6 10*3/uL (ref 3.4–10.8)

## 2021-09-28 LAB — RPR: RPR Ser Ql: NONREACTIVE

## 2021-09-28 LAB — HIV ANTIBODY (ROUTINE TESTING W REFLEX): HIV Screen 4th Generation wRfx: NONREACTIVE

## 2021-09-28 LAB — ANTIBODY SCREEN: Antibody Screen: NEGATIVE

## 2021-09-28 LAB — GLUCOSE TOLERANCE, 2 HOURS W/ 1HR
Glucose, 1 hour: 127 mg/dL (ref 70–179)
Glucose, 2 hour: 117 mg/dL (ref 70–152)
Glucose, Fasting: 78 mg/dL (ref 70–91)

## 2021-10-25 ENCOUNTER — Encounter: Payer: Medicaid Other | Admitting: Women's Health

## 2021-10-27 ENCOUNTER — Encounter: Payer: Medicaid Other | Admitting: Women's Health

## 2021-10-28 ENCOUNTER — Ambulatory Visit (INDEPENDENT_AMBULATORY_CARE_PROVIDER_SITE_OTHER): Payer: Medicaid Other | Admitting: Women's Health

## 2021-10-28 ENCOUNTER — Encounter: Payer: Self-pay | Admitting: Women's Health

## 2021-10-28 VITALS — BP 114/75 | HR 84 | Wt 168.0 lb

## 2021-10-28 DIAGNOSIS — Z3403 Encounter for supervision of normal first pregnancy, third trimester: Secondary | ICD-10-CM

## 2021-10-28 NOTE — Patient Instructions (Signed)
Edson Snowball, thank you for choosing our office today! We appreciate the opportunity to meet your healthcare needs. You may receive a short survey by mail, e-mail, or through [employer]. If you are happy with your care we would appreciate if you could take just a few minutes to complete the survey questions. We read all of your comments and take your feedback very seriously. Thank you again for choosing our office.  ?Center for [employer] Team at [facility] ? ?[facility] & [facility] Center at [facility] ?([address] [place], [place] [address]) ?Entrance C, located off of [address] ?Free 24/7 valet parking  ? ?CLASSES: Go to [email or web address] to register for classes (childbirth, breastfeeding, waterbirth, infant CPR, daddy bootcamp, etc.) ? ?Call the office [phone]) or go to [facility] if: ?You begin to have strong, frequent contractions ?Your water breaks.  Sometimes it is a big gush of fluid, sometimes it is just a trickle that keeps getting your panties wet or running down your legs ?You have vaginal bleeding.  It is normal to have a small amount of spotting if your cervix was checked.  ?You don't feel your baby moving like normal.  If you don't, get you something to eat and drink and lay down and focus on feeling your baby move.   If your baby is still not moving like normal, you should call the office or go to [facility]. ? ?Call the office [phone]) or go to [facility] hospital for these signs of pre-eclampsia: ?Severe headache that does not go away with Tylenol ?Visual changes- seeing spots, double, blurred vision ?Pain under your right breast or upper abdomen that does not go away with Tums or heartburn medicine ?Nausea and/or vomiting ?Severe swelling in your hands, feet, and face  ? ?Tdap Vaccine ?It is recommended that you get the Tdap vaccine during the third trimester of EACH pregnancy to help protect your baby from getting pertussis (whooping cough) ?27-36 weeks is the BEST time to do  this so that you can pass the protection on to your baby. During pregnancy is better than after pregnancy, but if you are unable to get it during pregnancy it will be offered at the hospital.  ?You can get this vaccine with [name], at the health department, your family doctor, or some local pharmacies ?Everyone who will be around your baby should also be up-to-date on their vaccines before the baby comes. Adults (who are not pregnant) only need 1 dose of Tdap during adulthood.  ? ?[place] Pediatricians/Family Doctors ?[facility]): [address], [phone]           ?[facility]: [address], [phone]                ?[facility]): [address], [phone] (call to ask if accepting patients) ?[facility] Department: [address], [place], [phone]   ? ?[place] Pediatricians/Family Doctors ?[facility]): [address], [phone] ?[facility]: [address] [place], [phone] ?[facility] [place]: [address], [phone] ? ?[employer] Family Doctors  ?[facility]): [phone] ?[facility]: [address], [phone]  ? ?[facility] Family Doctors ?[facility]: [address], [phone] ? ?[facility]  ?[facility]: [phone], [phone] ? ?Home Blood Pressure Monitoring for Patients  ? ?Your provider has recommended that you check your  blood pressure (BP) at least once a week at home. If you do not have a blood pressure cuff at home, one will be provided for you. Contact your provider if you have not received your monitor within 1 week.  ? ?Helpful Tips for Accurate Home Blood Pressure Checks  ?Don't smoke, exercise, or drink caffeine 30 minutes before checking your BP ?Use the restroom before checking your BP (a full bladder can raise your  pressure) ?Relax in a comfortable upright chair ?Feet on the ground ?Left arm resting comfortably on a flat surface at the level of your heart ?Legs uncrossed ?Back supported ?Sit quietly and don't talk ?Place the cuff on your bare arm ?Adjust snuggly, so that only two fingertips can fit between your skin and the top of the cuff ?Check 2 readings separated by at least one minute ?Keep a log of your BP readings ?For a visual, please reference this diagram: http://ccnc.care/bpdiagram ? ?Provider Name: [facility]     Phone: [phone] ? ?Zone 1: ALL CLEAR  ?Continue to monitor your symptoms:  ?BP reading is less than 140 (top number) or less than 90 (bottom number)  ?No right upper stomach pain ?No headaches or seeing spots ?No feeling nauseated or throwing up ?No swelling in face and hands ? ?Zone 2: CAUTION ?Call your doctor's office for any of the following:  ?BP reading is greater than 140 (top number) or greater than 90 (bottom number)  ?Stomach pain under your ribs in the middle or right side ?Headaches or seeing spots ?Feeling nauseated or throwing up ?Swelling in face and hands ? ?Zone 3: EMERGENCY  ?Seek immediate medical care if you have any of the following:  ?BP reading is greater than160 (top number) or greater than 110 (bottom number) ?Severe headaches not improving with Tylenol ?Serious difficulty catching your breath ?Any worsening symptoms from Zone 2  ?Preterm Labor and Birth Information ? ?The normal length of a pregnancy is 39-41 weeks. Preterm labor is when labor starts before 37 completed weeks of pregnancy. ?What are the risk factors for preterm labor? ?Preterm labor is more likely to occur in women who: ?Have certain infections during pregnancy such as a bladder infection, sexually transmitted infection, or infection inside the uterus (chorioamnionitis). ?Have a shorter-than-normal cervix. ?Have gone into preterm labor before. ?Have had surgery on their cervix. ?Are younger than age 72  or older than age 108. ?Are African American. ?Are pregnant with twins or multiple babies (multiple gestation). ?Take street drugs or smoke while pregnant. ?Do not gain enough weight while pregnant. ?Became pregnant shortly after having been pregnant. ?What are the symptoms of preterm labor? ?Symptoms of preterm labor include: ?Cramps similar to those that can happen during a menstrual period. The cramps may happen with diarrhea. ?Pain in the abdomen or lower back. ?Regular uterine contractions that may feel like tightening of the abdomen. ?A feeling of increased pressure in the pelvis. ?Increased watery or bloody mucus discharge from the vagina. ?Water breaking (ruptured amniotic sac). ?Why is it important to recognize signs of preterm labor? ?It is important to recognize signs of preterm labor because babies who are born prematurely may not be fully developed. This can put them at an increased risk for: ?Long-term (chronic) heart and lung problems. ?Difficulty immediately after birth with regulating body systems, including blood sugar, body temperature, heart rate, and breathing rate. ?Bleeding in the brain. ?Cerebral palsy. ?Learning difficulties. ?Death. ?These risks are highest for babies who are born before 11 weeks  of pregnancy. ?How is preterm labor treated? ?Treatment depends on the length of your pregnancy, your condition, and the health of your baby. It may involve: ?Having a stitch (suture) placed in your cervix to prevent your cervix from opening too early (cerclage). ?Taking or being given medicines, such as: ?Hormone medicines. These may be given early in pregnancy to help support the pregnancy. ?Medicine to stop contractions. ?Medicines to help mature the baby?s lungs. These may be prescribed if the risk of delivery is high. ?Medicines to prevent your baby from developing cerebral palsy. ?If the labor happens before 34 weeks of pregnancy, you may need to stay in the hospital. ?What should I do if I  think I am in preterm labor? ?If you think that you are going into preterm labor, call your health care provider right away. ?How can I prevent preterm labor in future pregnancies? ?To increase your chanc

## 2021-10-28 NOTE — Progress Notes (Signed)
? ? ?LOW-RISK PREGNANCY VISIT ?Patient name: Carly Bates MRN [id number]  Date of birth: 2001/05/07 ?Chief Complaint:   ?Routine Prenatal Visit ? ?History of Present Illness:   ?Carly Bates is a 21 y.o. G41P0000 female at [redacted]w[redacted]d with an Estimated Date of Delivery: 12/15/21 being seen today for ongoing management of a low-risk pregnancy.  ? ?Today she reports no complaints. Contractions: Not present. Vag. Bleeding: None.  Movement: Present. denies leaking of fluid. ? ? ?  09/27/2021  ? 10:07 AM 06/14/2021  ? 10:01 AM 04/21/2020  ?  3:48 PM  ?Depression screen PHQ 2/9  ?Decreased Interest 2 2 0  ?Down, Depressed, Hopeless 1 0 0  ?PHQ - 2 Score 3 2 0  ?Altered sleeping 1 1   ?Tired, decreased energy 2 1   ?Change in appetite 3 1   ?Feeling bad or failure about yourself  1 0   ?Trouble concentrating 2 0   ?Moving slowly or fidgety/restless 0 0   ?Suicidal thoughts 0 0   ?PHQ-9 Score 12 5   ? ?  ? ?  09/27/2021  ? 10:07 AM 06/14/2021  ? 10:01 AM  ?GAD 7 : Generalized Anxiety Score  ?Nervous, Anxious, on Edge 1 0  ?Control/stop worrying 1 1  ?Worry too much - different things 1 0  ?Trouble relaxing 1 0  ?Restless 0 0  ?Easily annoyed or irritable 1 1  ?Afraid - awful might happen 0 0  ?Total GAD 7 Score 5 2  ? ? ?  ?Review of Systems:   ?Pertinent items are noted in HPI ?Denies abnormal vaginal discharge w/ itching/odor/irritation, headaches, visual changes, shortness of breath, chest pain, abdominal pain, severe nausea/vomiting, or problems with urination or bowel movements unless otherwise stated above. ?Pertinent History Reviewed:  ?Reviewed past medical,surgical, social, obstetrical and family history.  ?Reviewed problem list, medications and allergies. ?Physical Assessment:  ? ?Vitals:  ? 10/28/21 1034  ?BP: 114/75  ?Pulse: 84  ?Weight: 168 lb (76.2 kg)  ?Body mass index is 29.76 kg/m?. ?  ?     Physical Examination:  ? General appearance: Well appearing, and in no distress ? Mental status: Alert, oriented to person,  place, and time ? Skin: Warm & dry ? Cardiovascular: Normal heart rate noted ? Respiratory: Normal respiratory effort, no distress ? Abdomen: Soft, gravid, nontender ? Pelvic: Cervical exam deferred        ? Extremities: Edema: None ? ?Fetal Status: Fetal Heart Rate (bpm): 147 Fundal Height: 31 cm Movement: Present   ? ?Chaperone: N/A   ?No results found for this or any previous visit (from the past 24 hour(s)).  ?Assessment & Plan:  ?1) Low-risk pregnancy G1P0000 at [redacted]w[redacted]d with an Estimated Date of Delivery: 12/15/21  ?  ?Meds: No orders of the defined types were placed in this encounter. ? ?Labs/procedures today: none ? ?Plan:  Continue routine obstetrical care  ?Next visit: prefers in person   ? ?Reviewed: Preterm labor symptoms and general obstetric precautions including but not limited to vaginal bleeding, contractions, leaking of fluid and fetal movement were reviewed in detail with the patient.  All questions were answered. Does have home bp cuff. Office bp cuff given: not applicable. Check bp weekly, let us know if consistently >140 and/or >90. ? ?Follow-up: Return in about 2 weeks (around 11/11/2021) for LROB, CNM, in person; then weekly LROB w/ CNM in person. ? ?Future Appointments  ?Date Time Provider Department Center  ?10/28/2021 10:50 AM Cheral Marker, CNM [facility]  ?11/11/2021 11:10 AM Cheral Marker, CNM [facility]  ? ? ?No orders of the defined types were placed in this encounter. ? ?Cheral Marker CNM, WHNP-BC ?10/28/2021 ?10:47 AM  ?

## 2021-11-11 ENCOUNTER — Encounter: Payer: Self-pay | Admitting: Obstetrics & Gynecology

## 2021-11-11 ENCOUNTER — Ambulatory Visit (INDEPENDENT_AMBULATORY_CARE_PROVIDER_SITE_OTHER): Payer: Medicaid Other | Admitting: Obstetrics & Gynecology

## 2021-11-11 VITALS — BP 124/80 | HR 91 | Wt 164.6 lb

## 2021-11-11 DIAGNOSIS — Z3403 Encounter for supervision of normal first pregnancy, third trimester: Secondary | ICD-10-CM

## 2021-11-11 NOTE — Progress Notes (Signed)
? ?  LOW-RISK PREGNANCY VISIT ?Patient name: Carly Bates MRN [id number]  Date of birth: 2001/02/01 ?Chief Complaint:   ?Routine Prenatal Visit ? ?History of Present Illness:   ?Carly Bates is a 21 y.o. G1P0000 female at [redacted]w[redacted]d with an Estimated Date of Delivery: 12/15/21 being seen today for ongoing management of a low-risk pregnancy.  ? ? ?  09/27/2021  ? 10:07 AM 06/14/2021  ? 10:01 AM 04/21/2020  ?  3:48 PM  ?Depression screen PHQ 2/9  ?Decreased Interest 2 2 0  ?Down, Depressed, Hopeless 1 0 0  ?PHQ - 2 Score 3 2 0  ?Altered sleeping 1 1   ?Tired, decreased energy 2 1   ?Change in appetite 3 1   ?Feeling bad or failure about yourself  1 0   ?Trouble concentrating 2 0   ?Moving slowly or fidgety/restless 0 0   ?Suicidal thoughts 0 0   ?PHQ-9 Score 12 5   ? ? ?Today she reports no complaints. Contractions: Not present. Vag. Bleeding: None.  Movement: Present. denies leaking of fluid. ?Review of Systems:   ?Pertinent items are noted in HPI ?Denies abnormal vaginal discharge w/ itching/odor/irritation, headaches, visual changes, shortness of breath, chest pain, abdominal pain, severe nausea/vomiting, or problems with urination or bowel movements unless otherwise stated above. ?Pertinent History Reviewed:  ?Reviewed past medical,surgical, social, obstetrical and family history.  ?Reviewed problem list, medications and allergies. ? ?Physical Assessment:  ? ?Vitals:  ? 11/11/21 1043  ?BP: 124/80  ?Pulse: 91  ?Weight: 164 lb 9.6 oz (74.7 kg)  ?Body mass index is 29.16 kg/m?. ?  ?     Physical Examination:  ? General appearance: Well appearing, and in no distress ? Mental status: Alert, oriented to person, place, and time ? Skin: Warm & dry ? Respiratory: Normal respiratory effort, no distress ? Abdomen: Soft, gravid, nontender ? Pelvic: Cervical exam deferred        ? Extremities: Edema: None ? Psych:  mood and affect appropriate ? ?Fetal Status: Fetal Heart Rate (bpm): 140 Fundal Height: 34 cm Movement: Present    ? ?Chaperone: n/a   ? ?No results found for this or any previous visit (from the past 24 hour(s)).  ? ?Assessment & Plan:  ?1) Low-risk pregnancy G1P0000 at [redacted]w[redacted]d with an Estimated Date of Delivery: 12/15/21  ?  ?Meds: No orders of the defined types were placed in this encounter. ? ?Labs/procedures today: none, GBS next visit ? ?Plan:  Continue routine obstetrical care  ?Next visit: prefers in person   ? ?Reviewed: Preterm labor symptoms and general obstetric precautions including but not limited to vaginal bleeding, contractions, leaking of fluid and fetal movement were reviewed in detail with the patient.  All questions were answered. Pt has home bp cuff. Check bp weekly, let us know if >140/90.  ? ?Follow-up: Return in about 1 week (around 11/18/2021) for LROB visit. ? ?No orders of the defined types were placed in this encounter. ? ? ?Myna Hidalgo, DO ?Attending Obstetrician & Gynecologist, Faculty Practice ?Center for [employer], [facility] ? ? ? ?

## 2021-11-17 ENCOUNTER — Ambulatory Visit (INDEPENDENT_AMBULATORY_CARE_PROVIDER_SITE_OTHER): Payer: Medicaid Other | Admitting: Advanced Practice Midwife

## 2021-11-17 ENCOUNTER — Other Ambulatory Visit (HOSPITAL_COMMUNITY)
Admission: RE | Admit: 2021-11-17 | Discharge: 2021-11-17 | Disposition: A | Discharge status: Home / Self Care | Payer: Medicaid Other | Source: Ambulatory Visit | Attending: Advanced Practice Midwife | Admitting: Advanced Practice Midwife

## 2021-11-17 VITALS — BP 106/76 | HR 95 | Wt 165.0 lb

## 2021-11-17 DIAGNOSIS — Z3A36 36 weeks gestation of pregnancy: Secondary | ICD-10-CM

## 2021-11-17 DIAGNOSIS — Z3403 Encounter for supervision of normal first pregnancy, third trimester: Secondary | ICD-10-CM

## 2021-11-17 DIAGNOSIS — Z3493 Encounter for supervision of normal pregnancy, unspecified, third trimester: Secondary | ICD-10-CM | POA: Diagnosis not present

## 2021-11-17 LAB — OB RESULTS CONSOLE GBS: GBS: NEGATIVE

## 2021-11-17 NOTE — Progress Notes (Signed)
? ?  LOW-RISK PREGNANCY VISIT ?Patient name: Carly Bates MRN [id number]  Date of birth: 10-10-2000 ?Chief Complaint:   ?Routine Prenatal Visit ? ?History of Present Illness:   ?Carly Bates is a 21 y.o. G1P0000 female at [redacted]w[redacted]d with an Estimated Date of Delivery: 12/15/21 being seen today for ongoing management of a low-risk pregnancy.  ?Today she reports no complaints. Contractions: Not present. Vag. Bleeding: None.  Movement: Present. denies leaking of fluid. ?Review of Systems:   ?Pertinent items are noted in HPI ?Denies abnormal vaginal discharge w/ itching/odor/irritation, headaches, visual changes, shortness of breath, chest pain, abdominal pain, severe nausea/vomiting, or problems with urination or bowel movements unless otherwise stated above. ?Pertinent History Reviewed:  ?Reviewed past medical,surgical, social, obstetrical and family history.  ?Reviewed problem list, medications and allergies. ?Physical Assessment:  ? ?Vitals:  ? 11/17/21 1025  ?BP: 106/76  ?Pulse: 95  ?Weight: 165 lb (74.8 kg)  ?Body mass index is 29.23 kg/m?. ?  ?     Physical Examination:  ? General appearance: Well appearing, and in no distress ? Mental status: Alert, oriented to person, place, and time ? Skin: Warm & dry ? Cardiovascular: Normal heart rate noted ? Respiratory: Normal respiratory effort, no distress ? Abdomen: Soft, gravid, nontender ? Pelvic: Cervical exam deferred        ? Extremities: Edema: None ? ?Fetal Status: Fetal Heart Rate (bpm): 150 Fundal Height: 36 cm Movement: Present Presentation: Vertex ? ?No results found for this or any previous visit (from the past 24 hour(s)).  ?Assessment & Plan:  ?1) Low-risk pregnancy G1P0000 at [redacted]w[redacted]d with an Estimated Date of Delivery: 12/15/21  ? ?2) Childhood PCN allergy with unk rxn, sensitivities w GBS; issues reviewed ?  ?Meds: No orders of the defined types were placed in this encounter. ? ?Labs/procedures today: GBS/cultures ? ?Plan:  Continue routine obstetrical care   ? ?Reviewed: Preterm labor symptoms and general obstetric precautions including but not limited to vaginal bleeding, contractions, leaking of fluid and fetal movement were reviewed in detail with the patient.  All questions were answered. Has home bp cuff.  Check bp weekly, let us know if >140/90.  ? ?Follow-up: Return in about 1 week (around 11/24/2021) for As scheduled. ? ?Orders Placed This Encounter  ?Procedures  ? Strep Gp B NAA+Rflx  ? ?Myrtis Ser CNM ?11/17/2021 ?10:44 AM  ?

## 2021-11-18 LAB — CERVICOVAGINAL ANCILLARY ONLY
Chlamydia: NEGATIVE
Comment: NEGATIVE
Comment: NORMAL
Neisseria Gonorrhea: NEGATIVE

## 2021-11-19 LAB — STREP GP B NAA+RFLX: Strep Gp B NAA+Rflx: NEGATIVE

## 2021-11-25 ENCOUNTER — Ambulatory Visit (INDEPENDENT_AMBULATORY_CARE_PROVIDER_SITE_OTHER): Payer: Medicaid Other | Admitting: Women's Health

## 2021-11-25 VITALS — BP 127/84 | HR 80 | Wt 165.8 lb

## 2021-11-25 DIAGNOSIS — Z3403 Encounter for supervision of normal first pregnancy, third trimester: Secondary | ICD-10-CM

## 2021-11-25 DIAGNOSIS — B001 Herpesviral vesicular dermatitis: Secondary | ICD-10-CM

## 2021-11-25 MED ORDER — VALACYCLOVIR HCL 1 G PO TABS: 2000.0000 mg | ORAL_TABLET | Freq: Two times a day (BID) | ORAL | 3 refills | Status: DC

## 2021-11-25 NOTE — Patient Instructions (Signed)
Edson Snowball, thank you for choosing our office today! We appreciate the opportunity to meet your healthcare needs. You may receive a short survey by mail, e-mail, or through [employer]. If you are happy with your care we would appreciate if you could take just a few minutes to complete the survey questions. We read all of your comments and take your feedback very seriously. Thank you again for choosing our office.  ?Center for [employer] Team at [facility] ? ?[facility] at [facility] ?([address] [place], [place] [address]) ?Entrance C, located off of [address] ?Free 24/7 valet parking  ? ?CLASSES: Go to [email or web address] to register for classes (childbirth, breastfeeding, waterbirth, infant CPR, daddy bootcamp, etc.) ? ?Call the office [phone]) or go to [facility] if: ?You begin to have strong, frequent contractions ?Your water breaks.  Sometimes it is a big gush of fluid, sometimes it is just a trickle that keeps getting your panties wet or running down your legs ?You have vaginal bleeding.  It is normal to have a small amount of spotting if your cervix was checked.  ?You don't feel your baby moving like normal.  If you don't, get you something to eat and drink and lay down and focus on feeling your baby move.   If your baby is still not moving like normal, you should call the office or go to [facility]. ? ?Call the office [phone]) or go to [facility] for these signs of pre-eclampsia: ?Severe headache that does not go away with Tylenol ?Visual changes- seeing spots, double, blurred vision ?Pain under your right breast or upper abdomen that does not go away with Tums or heartburn medicine ?Nausea and/or vomiting ?Severe swelling in your hands, feet, and face  ? ?[place] Pediatricians/Family Doctors ?[facility]): [address], [phone]           ?[facility]: [address], [phone]                 ?[facility]): [address], [phone] (call to ask if accepting patients) ?[facility]: [address], [place], [phone]   ? ?[place] Pediatricians/Family Doctors ?[facility]): [address], [phone] ?[facility]: [address] [place], [phone] ?[facility]: [address], [phone] ? ?[employer] Family Doctors  ?[facility]): [phone] ?[facility]: [address], [phone]  ? ?[facility] Family Doctors ?[facility]: [address], [phone] ? ?[facility]  ?[facility]: [phone], [phone] ? ?Home Blood Pressure Monitoring for Patients  ? ?Your provider has recommended that you check your blood pressure (BP) at least once a week at home. If you do not have a blood pressure cuff at home, one will be provided for you. Contact your provider if you have not received your monitor within 1 week.  ? ?Helpful Tips for Accurate Home Blood Pressure Checks  ?Don't smoke, exercise, or drink caffeine 30 minutes before checking your BP ?Use the restroom before checking your BP (a full bladder can raise your pressure) ?Relax in a comfortable upright chair ?Feet on the ground ?Left arm resting comfortably on a flat surface at the level of your heart ?Legs uncrossed ?Back supported ?Sit quietly and don't talk ?Place the cuff on your bare arm ?Adjust snuggly, so that only two fingertips  can fit between your skin and the top of the cuff ?Check 2 readings separated by at least one minute ?Keep a log of your BP readings ?For a visual, please reference this diagram: http://ccnc.care/bpdiagram ? ?Provider Name: [facility]     Phone: [phone] ? ?Zone 1: ALL CLEAR  ?Continue to monitor your symptoms:  ?BP reading is less than 140 (top number) or less than 90 (bottom number)  ?No right  upper stomach pain ?No headaches or seeing spots ?No feeling nauseated or throwing up ?No swelling in face and hands ? ?Zone 2: CAUTION ?Call your doctor's office for any of the following:  ?BP reading is greater than 140 (top number) or greater than 90 (bottom number)  ?Stomach pain under your ribs in the middle or right side ?Headaches or seeing spots ?Feeling nauseated or throwing up ?Swelling in face and hands ? ?Zone 3: EMERGENCY  ?Seek immediate medical care if you have any of the following:  ?BP reading is greater than160 (top number) or greater than 110 (bottom number) ?Severe headaches not improving with Tylenol ?Serious difficulty catching your breath ?Any worsening symptoms from Zone 2  ? ?Braxton Hicks Contractions ?Contractions of the uterus can occur throughout pregnancy, but they are not always a sign that you are in labor. You may have practice contractions called Braxton Hicks contractions. These false labor contractions are sometimes confused with true labor. ?What are [place] contractions? ?Braxton Hicks contractions are tightening movements that occur in the muscles of the uterus before labor. Unlike true labor contractions, these contractions do not result in opening (dilation) and thinning of the cervix. Toward the end of pregnancy (32-34 weeks), Braxton Hicks contractions can happen more often and may become stronger. These contractions are sometimes difficult to tell apart from true labor because they can be very uncomfortable. You should not feel embarrassed if you go to the hospital with false labor. ?Sometimes, the only way to tell if you are in true labor is for your health care provider to look for changes in the cervix. The health care provider will do a physical exam and may monitor your contractions. If you are not in true labor, the exam should show that your cervix is not dilating and your water has not broken. ?If there are no other health problems associated with your  pregnancy, it is completely safe for you to be sent home with false labor. You may continue to have Braxton Hicks contractions until you go into true labor. ?How to tell the difference between true labor and false labor ?True labor ?Contractions last 30-70 seconds. ?Contractions become very regular. ?Discomfort is usually felt in the top of the uterus, and it spreads to the lower abdomen and low back. ?Contractions do not go away with walking. ?Contractions usually become more intense and increase in frequency. ?The cervix dilates and gets thinner. ?False labor ?Contractions are usually shorter and not as strong as true labor contractions. ?Contractions are usually irregular. ?Contractions are often felt in the front of the lower abdomen and in the groin. ?Contractions may go away when you walk around or change positions while lying down. ?Contractions get weaker and are shorter-lasting as time goes on. ?The cervix usually does not dilate or become thin. ?Follow these instructions at home: ? ?Take over-the-counter and prescription medicines only as told by your health care provider. ?Keep up with your usual exercises and follow other instructions from your health care provider. ?Eat and drink lightly if you think  you are going into labor. ?If Braxton Hicks contractions are making you uncomfortable: ?Change your position from lying down or resting to walking, or change from walking to resting. ?Sit and rest in a tub of warm water. ?Drink enough fluid to keep your urine pale yellow. Dehydration may cause these contractions. ?Do slow and deep breathing several times an hour. ?Keep all follow-up prenatal visits as told by your health care provider. This is important. ?Contact a health care provider if: ?You have a fever. ?You have continuous pain in your abdomen. ?Get help right away if: ?Your contractions become stronger, more regular, and closer together. ?You have fluid leaking or gushing from your vagina. ?You pass  blood-tinged mucus (bloody show). ?You have bleeding from your vagina. ?You have low back pain that you never had before. ?You feel your baby?s head pushing down and causing pelvic pressure. ?Your baby is not

## 2021-11-25 NOTE — Progress Notes (Signed)
? ? ?LOW-RISK PREGNANCY VISIT ?Patient name: Carly Bates MRN [id number]  Date of birth: 04-11-2001 ?Chief Complaint:   ?Routine Prenatal Visit ? ?History of Present Illness:   ?Carly Bates is a 21 y.o. G58P0000 female at [redacted]w[redacted]d with an Estimated Date of Delivery: 12/15/21 being seen today for ongoing management of a low-risk pregnancy.  ? ?Today she reports  fever blister just popped up . Contractions: Irritability. Vag. Bleeding: None.  Movement: Present. denies leaking of fluid. ? ? ?  09/27/2021  ? 10:07 AM 06/14/2021  ? 10:01 AM 04/21/2020  ?  3:48 PM  ?Depression screen PHQ 2/9  ?Decreased Interest 2 2 0  ?Down, Depressed, Hopeless 1 0 0  ?PHQ - 2 Score 3 2 0  ?Altered sleeping 1 1   ?Tired, decreased energy 2 1   ?Change in appetite 3 1   ?Feeling bad or failure about yourself  1 0   ?Trouble concentrating 2 0   ?Moving slowly or fidgety/restless 0 0   ?Suicidal thoughts 0 0   ?PHQ-9 Score 12 5   ? ?  ? ?  09/27/2021  ? 10:07 AM 06/14/2021  ? 10:01 AM  ?GAD 7 : Generalized Anxiety Score  ?Nervous, Anxious, on Edge 1 0  ?Control/stop worrying 1 1  ?Worry too much - different things 1 0  ?Trouble relaxing 1 0  ?Restless 0 0  ?Easily annoyed or irritable 1 1  ?Afraid - awful might happen 0 0  ?Total GAD 7 Score 5 2  ? ? ?  ?Review of Systems:   ?Pertinent items are noted in HPI ?Denies abnormal vaginal discharge w/ itching/odor/irritation, headaches, visual changes, shortness of breath, chest pain, abdominal pain, severe nausea/vomiting, or problems with urination or bowel movements unless otherwise stated above. ?Pertinent History Reviewed:  ?Reviewed past medical,surgical, social, obstetrical and family history.  ?Reviewed problem list, medications and allergies. ?Physical Assessment:  ? ?Vitals:  ? 11/25/21 1146  ?BP: 127/84  ?Pulse: 80  ?Weight: 165 lb 12.8 oz (75.2 kg)  ?Body mass index is 29.37 kg/m?. ?  ?     Physical Examination:  ? General appearance: Well appearing, and in no distress ? Mental status:  Alert, oriented to person, place, and time ? Skin: Warm & dry ? Cardiovascular: Normal heart rate noted ? Respiratory: Normal respiratory effort, no distress ? Abdomen: Soft, gravid, nontender ? Pelvic: Cervical exam deferred        ? Extremities: Edema: None ? ?Fetal Status: Fetal Heart Rate (bpm): 145 Fundal Height: 36 cm Movement: Present   ? ?Chaperone: N/A   ?No results found for this or any previous visit (from the past 24 hour(s)).  ?Assessment & Plan:  ?1) Low-risk pregnancy G1P0000 at [redacted]w[redacted]d with an Estimated Date of Delivery: 12/15/21  ? ?2) Fever blister, rx valtrex, discussed if baby comes soon- do not kiss baby ?  ?Meds:  ?Meds ordered this encounter  ?Medications  ? valACYclovir (VALTREX) 1000 MG tablet  ?  Sig: Take 2 tablets (2,000 mg total) by mouth 2 (two) times daily. X 1 day  ?  Dispense:  4 tablet  ?  Refill:  3  ?  Order Specific Question:   Supervising Provider  ?  Answer:   Duane Lope H [[id number]]  ? ?Labs/procedures today: none ? ?Plan:  Continue routine obstetrical care  ?Next visit: prefers in person   ? ?Reviewed: Term labor symptoms and general obstetric precautions including but not limited to vaginal bleeding, contractions, leaking of  fluid and fetal movement were reviewed in detail with the patient.  All questions were answered. Does have home bp cuff. Office bp cuff given: not applicable. Check bp weekly, let us know if consistently >140 and/or >90. ? ?Follow-up: Return for As scheduled. ? ?Future Appointments  ?Date Time Provider Department Center  ?12/02/2021 11:50 AM Myna Hidalgo, DO [facility]  ?12/09/2021 11:50 AM Myna Hidalgo, DO [facility]  ? ? ?No orders of the defined types were placed in this encounter. ? ?Cheral Marker CNM, WHNP-BC ?11/25/2021 ?12:24 PM  ?

## 2021-12-02 ENCOUNTER — Encounter: Payer: Self-pay | Admitting: Obstetrics & Gynecology

## 2021-12-02 ENCOUNTER — Encounter: Payer: Self-pay | Admitting: *Deleted

## 2021-12-02 ENCOUNTER — Ambulatory Visit (INDEPENDENT_AMBULATORY_CARE_PROVIDER_SITE_OTHER): Payer: Medicaid Other | Admitting: Obstetrics & Gynecology

## 2021-12-02 VITALS — BP 127/81 | HR 94 | Wt 167.8 lb

## 2021-12-02 DIAGNOSIS — Z3403 Encounter for supervision of normal first pregnancy, third trimester: Secondary | ICD-10-CM

## 2021-12-02 NOTE — Progress Notes (Signed)
? ?  LOW-RISK PREGNANCY VISIT ?Patient name: Carly Bates MRN [id number]  Date of birth: 08-29-2000 ?Chief Complaint:   ?Routine Prenatal Visit ? ?History of Present Illness:   ?Carly Bates is a 21 y.o. G83P0000 female at [redacted]w[redacted]d with an Estimated Date of Delivery: 12/15/21 being seen today for ongoing management of a low-risk pregnancy.  ? ?  09/27/2021  ? 10:07 AM 06/14/2021  ? 10:01 AM 04/21/2020  ?  3:48 PM  ?Depression screen PHQ 2/9  ?Decreased Interest 2 2 0  ?Down, Depressed, Hopeless 1 0 0  ?PHQ - 2 Score 3 2 0  ?Altered sleeping 1 1   ?Tired, decreased energy 2 1   ?Change in appetite 3 1   ?Feeling bad or failure about yourself  1 0   ?Trouble concentrating 2 0   ?Moving slowly or fidgety/restless 0 0   ?Suicidal thoughts 0 0   ?PHQ-9 Score 12 5   ? ? ?Today she reports no complaints. Contractions: Irritability. Vag. Bleeding: None.  Movement: Present. denies leaking of fluid. ?Review of Systems:   ?Pertinent items are noted in HPI ?Denies abnormal vaginal discharge w/ itching/odor/irritation, headaches, visual changes, shortness of breath, chest pain, abdominal pain, severe nausea/vomiting, or problems with urination or bowel movements unless otherwise stated above. ?Pertinent History Reviewed:  ?Reviewed past medical,surgical, social, obstetrical and family history.  ?Reviewed problem list, medications and allergies. ? ?Physical Assessment:  ? ?Vitals:  ? 12/02/21 1126  ?BP: 127/81  ?Pulse: 94  ?Weight: 167 lb 12.8 oz (76.1 kg)  ?Body mass index is 29.72 kg/m?. ?  ?     Physical Examination:  ? General appearance: Well appearing, and in no distress ? Mental status: Alert, oriented to person, place, and time ? Skin: Warm & dry ? Respiratory: Normal respiratory effort, no distress ? Abdomen: Soft, gravid, nontender ? Pelvic: Cervical exam deferred        ? Extremities: Edema: None ? Psych:  mood and affect appropriate ? ?Fetal Status: Fetal Heart Rate (bpm): 135 Fundal Height: 37 cm Movement: Present    ? ?Chaperone: n/a   ? ?No results found for this or any previous visit (from the past 24 hour(s)).  ? ?Assessment & Plan:  ?1) Low-risk pregnancy G1P0000 at [redacted]w[redacted]d with an Estimated Date of Delivery: 12/15/21  ? ?-expectant management ? ?Meds: No orders of the defined types were placed in this encounter. ? ?Labs/procedures today: none ? ?Plan:  Continue routine obstetrical care  ?Next visit: prefers in person   ? ?Reviewed: Term labor symptoms and general obstetric precautions including but not limited to vaginal bleeding, contractions, leaking of fluid and fetal movement were reviewed in detail with the patient.  All questions were answered. Pt has home bp cuff. Check bp weekly, let us know if >140/90.  ? ?Follow-up: Return in about 1 week (around 12/09/2021) for LROB visit. ? ?No orders of the defined types were placed in this encounter. ? ? ?Myna Hidalgo, DO ?Attending Obstetrician & Gynecologist, Faculty Practice ?Center for [employer], [facility] ? ? ? ?

## 2021-12-09 ENCOUNTER — Encounter: Payer: Self-pay | Admitting: Obstetrics & Gynecology

## 2021-12-09 ENCOUNTER — Ambulatory Visit (INDEPENDENT_AMBULATORY_CARE_PROVIDER_SITE_OTHER): Payer: Medicaid Other | Admitting: Obstetrics & Gynecology

## 2021-12-09 VITALS — BP 123/86 | HR 88 | Wt 167.2 lb

## 2021-12-09 DIAGNOSIS — Z3403 Encounter for supervision of normal first pregnancy, third trimester: Secondary | ICD-10-CM

## 2021-12-09 NOTE — Progress Notes (Signed)
? ?  LOW-RISK PREGNANCY VISIT ?Patient name: Carly Bates MRN [id number]  Date of birth: 2001-03-14 ?Chief Complaint:   ?Routine Prenatal Visit ? ?History of Present Illness:   ?Carly Bates is a 21 y.o. G57P0000 female at [redacted]w[redacted]d with an Estimated Date of Delivery: 12/15/21 being seen today for ongoing management of a low-risk pregnancy.  ? ? ?  09/27/2021  ? 10:07 AM 06/14/2021  ? 10:01 AM 04/21/2020  ?  3:48 PM  ?Depression screen PHQ 2/9  ?Decreased Interest 2 2 0  ?Down, Depressed, Hopeless 1 0 0  ?PHQ - 2 Score 3 2 0  ?Altered sleeping 1 1   ?Tired, decreased energy 2 1   ?Change in appetite 3 1   ?Feeling bad or failure about yourself  1 0   ?Trouble concentrating 2 0   ?Moving slowly or fidgety/restless 0 0   ?Suicidal thoughts 0 0   ?PHQ-9 Score 12 5   ? ? ?Today she reports occasional contractions. Contractions: Irregular. Vag. Bleeding: None.  Movement: Present. denies leaking of fluid. ?Review of Systems:   ?Pertinent items are noted in HPI ?Denies abnormal vaginal discharge w/ itching/odor/irritation, headaches, visual changes, shortness of breath, chest pain, abdominal pain, severe nausea/vomiting, or problems with urination or bowel movements unless otherwise stated above. ?Pertinent History Reviewed:  ?Reviewed past medical,surgical, social, obstetrical and family history.  ?Reviewed problem list, medications and allergies. ? ?Physical Assessment:  ? ?Vitals:  ? 12/09/21 1145 12/09/21 1150  ?BP: (!) 137/93 123/86  ?Pulse: 87 88  ?Weight: 167 lb 3.2 oz (75.8 kg)   ?Body mass index is 29.62 kg/m?. ?  ?     Physical Examination:  ? General appearance: Well appearing, and in no distress ? Mental status: Alert, oriented to person, place, and time ? Skin: Warm & dry ? Respiratory: Normal respiratory effort, no distress ? Abdomen: Soft, gravid, nontender ? Pelvic: Cervical exam performed Fetal head low in pelvis Dilation: Closed     ? Extremities: Edema: None ? Psych:  mood and affect appropriate ? ?Fetal  Status: Fetal Heart Rate (bpm): 145 Fundal Height: 37 cm Movement: Present Presentation: Vertex ? ?Chaperone:  pt declined    ? ?No results found for this or any previous visit (from the past 24 hour(s)).  ? ?Assessment & Plan:  ?1) Low-risk pregnancy G1P0000 at [redacted]w[redacted]d with an Estimated Date of Delivery: 12/15/21  ? ?-expectant management ?-NST next visit as pt will be 40+wks ?-discussed IOL ~ 41wks ?  ?Meds: No orders of the defined types were placed in this encounter. ? ?Labs/procedures today: none ? ?Plan:  Continue routine obstetrical care  ?Next visit: prefers in person   ? ?Reviewed: Term labor symptoms and general obstetric precautions including but not limited to vaginal bleeding, contractions, leaking of fluid and fetal movement were reviewed in detail with the patient.  All questions were answered.  ? ?Follow-up: Return in about 1 week (around 12/16/2021) for LROB visit and NST. ? ?No orders of the defined types were placed in this encounter. ? ? ?Myna Hidalgo, DO ?Attending Obstetrician & Gynecologist, Faculty Practice ?Center for [employer], [facility] ? ? ? ?

## 2021-12-14 ENCOUNTER — Inpatient Hospital Stay (HOSPITAL_COMMUNITY): Payer: Medicaid Other | Admitting: Anesthesiology

## 2021-12-14 ENCOUNTER — Other Ambulatory Visit: Payer: Self-pay

## 2021-12-14 ENCOUNTER — Encounter (HOSPITAL_COMMUNITY): Payer: Self-pay | Admitting: Obstetrics and Gynecology

## 2021-12-14 ENCOUNTER — Encounter (HOSPITAL_COMMUNITY): Payer: Self-pay | Admitting: Obstetrics & Gynecology

## 2021-12-14 ENCOUNTER — Inpatient Hospital Stay (HOSPITAL_COMMUNITY)
Admission: AD | Admit: 2021-12-14 | Discharge: 2021-12-16 | DRG: 806 | Disposition: A | Discharge status: Home / Self Care | Payer: Medicaid Other | Attending: Obstetrics and Gynecology | Admitting: Obstetrics and Gynecology

## 2021-12-14 ENCOUNTER — Inpatient Hospital Stay (EMERGENCY_DEPARTMENT_HOSPITAL)
Admission: AD | Admit: 2021-12-14 | Discharge: 2021-12-14 | Disposition: A | Discharge status: Home / Self Care | Payer: Medicaid Other | Source: Home / Self Care | Attending: Obstetrics and Gynecology | Admitting: Obstetrics and Gynecology

## 2021-12-14 DIAGNOSIS — Z3A39 39 weeks gestation of pregnancy: Secondary | ICD-10-CM | POA: Diagnosis not present

## 2021-12-14 DIAGNOSIS — Z3A40 40 weeks gestation of pregnancy: Secondary | ICD-10-CM | POA: Diagnosis not present

## 2021-12-14 DIAGNOSIS — Z8674 Personal history of sudden cardiac arrest: Secondary | ICD-10-CM

## 2021-12-14 DIAGNOSIS — O134 Gestational [pregnancy-induced] hypertension without significant proteinuria, complicating childbirth: Secondary | ICD-10-CM | POA: Diagnosis not present

## 2021-12-14 DIAGNOSIS — O26893 Other specified pregnancy related conditions, third trimester: Secondary | ICD-10-CM | POA: Diagnosis not present

## 2021-12-14 DIAGNOSIS — J45909 Unspecified asthma, uncomplicated: Secondary | ICD-10-CM | POA: Diagnosis not present

## 2021-12-14 DIAGNOSIS — J4520 Mild intermittent asthma, uncomplicated: Secondary | ICD-10-CM | POA: Diagnosis present

## 2021-12-14 DIAGNOSIS — O479 False labor, unspecified: Secondary | ICD-10-CM | POA: Diagnosis not present

## 2021-12-14 DIAGNOSIS — O480 Post-term pregnancy: Secondary | ICD-10-CM | POA: Insufficient documentation

## 2021-12-14 DIAGNOSIS — Z3689 Encounter for other specified antenatal screening: Secondary | ICD-10-CM

## 2021-12-14 DIAGNOSIS — O471 False labor at or after 37 completed weeks of gestation: Secondary | ICD-10-CM | POA: Insufficient documentation

## 2021-12-14 DIAGNOSIS — Z3403 Encounter for supervision of normal first pregnancy, third trimester: Secondary | ICD-10-CM

## 2021-12-14 DIAGNOSIS — O9952 Diseases of the respiratory system complicating childbirth: Secondary | ICD-10-CM | POA: Diagnosis present

## 2021-12-14 DIAGNOSIS — Z3493 Encounter for supervision of normal pregnancy, unspecified, third trimester: Secondary | ICD-10-CM

## 2021-12-14 DIAGNOSIS — O139 Gestational [pregnancy-induced] hypertension without significant proteinuria, unspecified trimester: Secondary | ICD-10-CM

## 2021-12-14 LAB — COMPREHENSIVE METABOLIC PANEL
ALT: 9 U/L (ref 0–44)
AST: 17 U/L (ref 15–41)
Albumin: 2.7 g/dL — ABNORMAL LOW (ref 3.5–5.0)
Alkaline Phosphatase: 190 U/L — ABNORMAL HIGH (ref 38–126)
Anion gap: 6 (ref 5–15)
BUN: <5 mg/dL — ABNORMAL LOW (ref 6–20)
CO2: 23 mmol/L (ref 22–32)
Calcium: 9.2 mg/dL (ref 8.9–10.3)
Chloride: 112 mmol/L — ABNORMAL HIGH (ref 98–111)
Creatinine, Ser: 0.82 mg/dL (ref 0.44–1.00)
GFR, Estimated: >60 mL/min (ref >60)
Glucose, Bld: 85 mg/dL (ref 70–99)
Potassium: 3.6 mmol/L (ref 3.5–5.1)
Sodium: 141 mmol/L (ref 135–145)
Total Bilirubin: 1 mg/dL (ref 0.3–1.2)
Total Protein: 6.5 g/dL (ref 6.5–8.1)

## 2021-12-14 LAB — CBC
HCT: 35 % — ABNORMAL LOW (ref 36.0–46.0)
Hemoglobin: 11.4 g/dL — ABNORMAL LOW (ref 12.0–15.0)
MCH: 27.9 pg (ref 26.0–34.0)
MCHC: 32.6 g/dL (ref 30.0–36.0)
MCV: 85.6 fL (ref 80.0–100.0)
Platelets: 322 10*3/uL (ref 150–400)
RBC: 4.09 MIL/uL (ref 3.87–5.11)
RDW: 13.8 % (ref 11.5–15.5)
WBC: 7.7 10*3/uL (ref 4.0–10.5)
nRBC: 0 % (ref 0.0–0.2)

## 2021-12-14 LAB — PROTEIN / CREATININE RATIO, URINE
Creatinine, Urine: 88.53 mg/dL
Total Protein, Urine: <6 mg/dL

## 2021-12-14 LAB — TYPE AND SCREEN
ABO/RH(D): O POS
Antibody Screen: NEGATIVE

## 2021-12-14 LAB — RPR: RPR Ser Ql: NONREACTIVE

## 2021-12-14 MED ORDER — LACTATED RINGERS IV SOLN
500.0000 mL | Freq: Once | INTRAVENOUS | Status: AC
  Administered 2021-12-14: 500 mL via INTRAVENOUS

## 2021-12-14 MED ORDER — LIDOCAINE HCL (PF) 1 % IJ SOLN
INTRAMUSCULAR | Status: DC | PRN
  Administered 2021-12-14: 10 mL via EPIDURAL
  Administered 2021-12-14: 2 mL via EPIDURAL

## 2021-12-14 MED ORDER — FENTANYL-BUPIVACAINE-NACL 0.5-0.125-0.9 MG/250ML-% EP SOLN
EPIDURAL | Status: DC | PRN
  Administered 2021-12-14: 12 mL/h via EPIDURAL

## 2021-12-14 MED ORDER — FENTANYL CITRATE (PF) 100 MCG/2ML IJ SOLN
50.0000 ug | INTRAMUSCULAR | Status: DC | PRN
  Administered 2021-12-14: 100 ug via INTRAVENOUS
  Filled 2021-12-14: qty 2

## 2021-12-14 MED ORDER — SCOPOLAMINE 1 MG/3DAYS TD PT72
1.0000 | MEDICATED_PATCH | TRANSDERMAL | Status: DC
  Administered 2021-12-14: 1.5 mg via TRANSDERMAL
  Filled 2021-12-14: qty 1

## 2021-12-14 MED ORDER — ACETAMINOPHEN 325 MG PO TABS: 650.0000 mg | ORAL_TABLET | ORAL | Status: DC | PRN

## 2021-12-14 MED ORDER — OXYTOCIN BOLUS FROM INFUSION
333.0000 mL | Freq: Once | INTRAVENOUS | Status: AC
  Administered 2021-12-15: 333 mL via INTRAVENOUS

## 2021-12-14 MED ORDER — EPHEDRINE 5 MG/ML INJ: 10.0000 mg | INTRAVENOUS | Status: DC | PRN

## 2021-12-14 MED ORDER — MISOPROSTOL 50MCG HALF TABLET
50.0000 ug | ORAL_TABLET | ORAL | Status: DC
  Administered 2021-12-14: 50 ug via BUCCAL
  Filled 2021-12-14: qty 1

## 2021-12-14 MED ORDER — DIPHENHYDRAMINE HCL 50 MG/ML IJ SOLN: 12.5000 mg | INTRAMUSCULAR | Status: DC | PRN

## 2021-12-14 MED ORDER — ACETAMINOPHEN 500 MG PO TABS
ORAL_TABLET | ORAL | Status: AC
  Administered 2021-12-14: 1000 mg via ORAL
  Filled 2021-12-14: qty 2

## 2021-12-14 MED ORDER — PHENYLEPHRINE 80 MCG/ML (10ML) SYRINGE FOR IV PUSH (FOR BLOOD PRESSURE SUPPORT): 80.0000 ug | PREFILLED_SYRINGE | INTRAVENOUS | Status: DC | PRN

## 2021-12-14 MED ORDER — LACTATED RINGERS IV SOLN
500.0000 mL | INTRAVENOUS | Status: DC | PRN
  Administered 2021-12-14: 500 mL via INTRAVENOUS

## 2021-12-14 MED ORDER — LACTATED RINGERS IV SOLN: INTRAVENOUS | Status: DC

## 2021-12-14 MED ORDER — OXYCODONE-ACETAMINOPHEN 5-325 MG PO TABS: 2.0000 | ORAL_TABLET | ORAL | Status: DC | PRN

## 2021-12-14 MED ORDER — ACETAMINOPHEN 500 MG PO TABS: 1000.0000 mg | ORAL_TABLET | Freq: Once | ORAL | Status: AC

## 2021-12-14 MED ORDER — FENTANYL-BUPIVACAINE-NACL 0.5-0.125-0.9 MG/250ML-% EP SOLN
12.0000 mL/h | EPIDURAL | Status: DC | PRN
  Filled 2021-12-14: qty 250

## 2021-12-14 MED ORDER — LIDOCAINE HCL (PF) 1 % IJ SOLN: 30.0000 mL | INTRAMUSCULAR | Status: DC | PRN

## 2021-12-14 MED ORDER — OXYTOCIN-SODIUM CHLORIDE 30-0.9 UT/500ML-% IV SOLN
2.5000 [IU]/h | INTRAVENOUS | Status: DC
  Administered 2021-12-15: 2.5 [IU]/h via INTRAVENOUS
  Filled 2021-12-14 (×2): qty 500

## 2021-12-14 MED ORDER — SODIUM CHLORIDE 0.9 % IV SOLN
12.5000 mg | Freq: Four times a day (QID) | INTRAVENOUS | Status: DC | PRN
  Administered 2021-12-14: 12.5 mg via INTRAVENOUS
  Filled 2021-12-14 (×3): qty 0.5

## 2021-12-14 MED ORDER — OXYCODONE-ACETAMINOPHEN 5-325 MG PO TABS: 1.0000 | ORAL_TABLET | ORAL | Status: DC | PRN

## 2021-12-14 MED ORDER — SOD CITRATE-CITRIC ACID 500-334 MG/5ML PO SOLN: 30.0000 mL | ORAL | Status: DC | PRN

## 2021-12-14 MED ORDER — ONDANSETRON HCL 4 MG/2ML IJ SOLN
4.0000 mg | Freq: Four times a day (QID) | INTRAMUSCULAR | Status: DC | PRN
  Administered 2021-12-14 – 2021-12-15 (×3): 4 mg via INTRAVENOUS
  Filled 2021-12-14 (×3): qty 2

## 2021-12-14 NOTE — Anesthesia Preprocedure Evaluation (Signed)
Anesthesia Evaluation  ?Patient identified by MRN, date of birth, ID band ?Patient awake ? ? ? ?Reviewed: ?Allergy & Precautions, Patient's Chart, lab work & pertinent test results ? ?Airway ?Mallampati: II ? ?TM Distance: >3 FB ?Neck ROM: Full ? ? ? Dental ?no notable dental hx. ? ?  ?Pulmonary ?asthma ,  ?  ?Pulmonary exam normal ?breath sounds clear to auscultation ? ? ? ? ? ? Cardiovascular ?Normal cardiovascular exam ?Rhythm:Regular Rate:Normal ? ? history of viral myocarditis w/ cardiopulmonary arrest at 46 month old-- no residual difficulty and does not follow with a cardiologist  ?  ?Neuro/Psych ?negative neurological ROS ? negative psych ROS  ? GI/Hepatic ?negative GI ROS, Neg liver ROS,   ?Endo/Other  ?negative endocrine ROS ? Renal/GU ?negative Renal ROS  ?negative genitourinary ?  ?Musculoskeletal ?negative musculoskeletal ROS ?(+)  ? Abdominal ?  ?Peds ?negative pediatric ROS ?(+)  Hematology ?negative hematology ROS ?(+) Hb 11.4, plt 322   ?Anesthesia Other Findings ? ? Reproductive/Obstetrics ?(+) Pregnancy ? ?  ? ? ? ? ? ? ? ? ? ? ? ? ? ?  ?  ? ? ? ? ? ? ? ? ?Anesthesia Physical ?Anesthesia Plan ? ?ASA: 2 ? ?Anesthesia Plan: Epidural  ? ?Post-op Pain Management:   ? ?Induction:  ? ?PONV Risk Score and Plan: 2 ? ?Airway Management Planned: Natural Airway ? ?Additional Equipment: None ? ?Intra-op Plan:  ? ?Post-operative Plan:  ? ?Informed Consent: I have reviewed the patients History and Physical, chart, labs and discussed the procedure including the risks, benefits and alternatives for the proposed anesthesia with the patient or authorized representative who has indicated his/her understanding and acceptance.  ? ? ? ? ? ?Plan Discussed with:  ? ?Anesthesia Plan Comments:   ? ? ? ? ? ? ?Anesthesia Quick Evaluation ? ?

## 2021-12-14 NOTE — Progress Notes (Signed)
Patient ID: Carly Bates, female   DOB: 23-Mar-2001, 21 y.o.   MRN: [id number] ?Carly Bates is a 21 y.o. G1P0000 at [redacted]w[redacted]d admitted for early labor and induction of labor due to NRNST, elevated bp in MAU ? ?Subjective: ?uncomfortable w/ contractions and requesting epidural ? ?Objective: ?BP 127/84   Pulse 83   Temp (!) 97.2 ?F (36.2 ?C) (Oral)   Resp 18   Ht 5\' 3"  (1.6 m)   Wt 75.8 kg   LMP 03/10/2021   SpO2 100%   BMI 29.58 kg/m?  ?No intake/output data recorded. ? ?FHR baseline 135 bpm, Variability: moderate, Accelerations:present, Decelerations:  Absent ?Toco: app q 2-11mins  ? ?Foley bulb in vagina, removed easily ?SVE:   Dilation: 4.5 ?Effacement (%): 70 ?Station: -2 ?Exam by:: [email or web address], CNM ? ? ? ?Labs: ?Lab Results  ?Component Value Date  ? WBC 7.7 12/14/2021  ? HGB 11.4 (L) 12/14/2021  ? HCT 35.0 (L) 12/14/2021  ? MCV 85.6 12/14/2021  ? PLT 322 12/14/2021  ? ? ?Assessment / Plan: ?IOL d/t NRFHR/elevated bp in MAU, s/p cervical ripening, foley bulb out, uncomfortable and requesting epidural. Plan expectant management for now, add pitocin if needed ? ?Labor: s/p cervical ripening ?Fetal Wellbeing:  Category I ?Pain Control:  requesting epidural ?Pre-eclampsia: bp's stable, only 1 elevated since being on L&D, labs wnl ?I/D:  GBS neg ?Anticipated MOD: NSVB ? ?12/16/2021 CNM, WHNP-BC ?12/14/2021, 7:32 PM  ?

## 2021-12-14 NOTE — MAU Provider Note (Signed)
Event Date/Time  ? First Provider Initiated Contact with Patient 12/14/21 0217   ?  ?S: [name] is a 21 y.o. G1P0000 at [redacted]w[redacted]d  who presents to MAU today complaining of leaking of fluid since 12pm today. She felt discharge come out three times, yellow each time, some after she'd voided. No gushes, just small leaks. She denies vaginal bleeding. She endorses contractions but is feeling them as small cramps with a lot of rectal discomfort. Has not timed them (did not think they were contractions but her friend endorses intermittent but repetitive discomfort). She reports normal fetal movement.   ? ?Pertinent items noted in HPI and remainder of comprehensive ROS otherwise negative.  ? ?Receives care from [facility], prenatal records reviewed. Uncomplicated pregnancy, planning IOL at 41wks if no delivery prior.  ? ?O: BP 140/88 (BP Location: Right Arm)   Pulse 89   Temp 98.8 ?F (37.1 ?C)   Resp 20   Ht 5' 3.5" (1.613 m)   Wt 168 lb 1.6 oz (76.2 kg)   LMP 03/10/2021   BMI 29.31 kg/m?  ?GENERAL: Well-developed, well-nourished female in no acute distress.  ?HEAD: Normocephalic, atraumatic.  ?CHEST: Normal effort of breathing, regular heart rate ?ABDOMEN: Soft, nontender, gravid ?PELVIC: Normal external female genitalia. Vagina is pink and rugated. Normal discharge.  No pooling, even after cough. ? ?Cervical exam:  ?Dilation: 1 ?Effacement (%): Thick ?Station: -3 ?Presentation: Vertex ?Exam by:: Joline Salt, RN ? ? ?Fetal Monitoring: reactive ?Baseline: 150 ?Variability: moderate ?Accelerations: 15x15 ?Decelerations: none ?Contractions: irregular q5-44min with periods of UI  ? ?Fern test negative x2. ? ?A: ?SIUP at [redacted]w[redacted]d  ?Membranes intact ?False labor ?NST reactive ? ?P: ?Discharge home in stable condition with term labor precautions ?- Explained that she could be in early labor but it would be best to go home and rest/ambulate until contractions are more regular and closer together. ?Follow up at  [facility] as scheduled for ongoing prenatal care. ? ?Bernerd Limbo, CNM ?12/14/2021 2:26 AM ? ?

## 2021-12-14 NOTE — Anesthesia Procedure Notes (Signed)
Epidural ?Patient location during procedure: OB ?Start time: 12/14/2021 7:56 PM ?End time: 12/14/2021 8:05 PM ? ?Staffing ?Anesthesiologist: Pervis Hocking, DO ?Performed: anesthesiologist  ? ?Preanesthetic Checklist ?Completed: patient identified, IV checked, risks and benefits discussed, monitors and equipment checked, pre-op evaluation and timeout performed ? ?Epidural ?Patient position: sitting ?Prep: DuraPrep and site prepped and draped ?Patient monitoring: continuous pulse ox, blood pressure, heart rate and cardiac monitor ?Approach: midline ?Location: L3-L4 ?Injection technique: LOR air ? ?Needle:  ?Needle type: Tuohy  ?Needle gauge: 17 G ?Needle length: 9 cm ?Needle insertion depth: 5.5 cm ?Catheter type: closed end flexible ?Catheter size: 19 Gauge ?Catheter at skin depth: 11 cm ?Test dose: negative ? ?Assessment ?Sensory level: T8 ?Events: blood not aspirated, injection not painful, no injection resistance, no paresthesia and negative IV test ? ?Additional Notes ?Patient identified. Risks/Benefits/Options discussed with patient including but not limited to bleeding, infection, nerve damage, paralysis, failed block, incomplete pain control, headache, blood pressure changes, nausea, vomiting, reactions to medication both or allergic, itching and postpartum back pain. Confirmed with bedside nurse the patient's most recent platelet count. Confirmed with patient that they are not currently taking any anticoagulation, have any bleeding history or any family history of bleeding disorders. Patient expressed understanding and wished to proceed. All questions were answered. Sterile technique was used throughout the entire procedure. Please see nursing notes for vital signs. Test dose was given through epidural catheter and negative prior to continuing to dose epidural or start infusion. Warning signs of high block given to the patient including shortness of breath, tingling/numbness in hands, complete motor  block, or any concerning symptoms with instructions to call for help. Patient was given instructions on fall risk and not to get out of bed. All questions and concerns addressed with instructions to call with any issues or inadequate analgesia.  Reason for block:procedure for pain ? ? ? ?

## 2021-12-14 NOTE — MAU Note (Signed)
Carly Bates is a 21 y.o. at 101w6d here in MAU reporting: contractions started this morning around 7 and they are every 3-5 minutes. No bleeding. States she is not sure about LOF, states could be discharge. +FM. ? ?Onset of complaint: today ? ?Pain score: 7/10 ? ?Vitals:  ? 12/14/21 1034  ?BP: 129/85  ?Pulse: 93  ?Resp: 18  ?Temp: 98.7 ?F (37.1 ?C)  ?SpO2: 100%  ?   ?FHT:155, FM palpated ? ?Lab orders placed from triage: none ? ?

## 2021-12-14 NOTE — Progress Notes (Signed)
Patient ID: Carly Bates, female   DOB: 30-Nov-2000, 21 y.o.   MRN: [id number] ?Carly Bates is a 21 y.o. G1P0000 at [redacted]w[redacted]d admitted for induction of labor due to early labor/NRNST, GHTN ? ?Subjective: ?comfortable with epidural and denies headache, visual changes, ruq/epigastric pain, n/v ? ?Objective: ?BP 122/64   Pulse 79   Temp (!) 97.2 ?F (36.2 ?C) (Oral)   Resp 18   Ht 5\' 3"  (1.6 m)   Wt 75.8 kg   LMP 03/10/2021   SpO2 99%   BMI 29.58 kg/m?  ?No intake/output data recorded. ? ?FHR baseline 140 bpm, Variability: minimal , Accelerations:not present (were just prior to getting phenergan) Decelerations:  Present  earlies, some mild variables ?Toco: q 2-24mins  ? ?SVE:   Dilation: 5 ?Effacement (%): 90 ?Station: 0 ?Exam by:: [email or web address], CNM ? ?AROM small amt mod MSF ? ?Labs: ?Lab Results  ?Component Value Date  ? WBC 7.7 12/14/2021  ? HGB 11.4 (L) 12/14/2021  ? HCT 35.0 (L) 12/14/2021  ? MCV 85.6 12/14/2021  ? PLT 322 12/14/2021  ? ? ?Assessment / Plan: ?IOL d/t early labor, NRNST, GHTN, s/p cytotec x 1, foley bulb out at 1920, now AROM'd- mod MSF ? ?Labor: early ?Fetal Wellbeing:  Category II ?Pain Control:  epidural ?Pre-eclampsia: asymptomatic, bp's stable, and labs stable ?I/D:  GBS neg ?Anticipated MOD: NSVB ? ?12/16/2021 CNM, WHNP-BC ?12/14/2021, 10:37 PM  ?

## 2021-12-14 NOTE — Progress Notes (Signed)
Labor Progress Note ?Carly Bates is a 21 y.o. G1P0000 at 107w6d presented for IOL due to NRFHT and elevated BP at term.  ? ?Initially presented about 1 hour ago and planned for cytotec + FB placement. Given pre-procedure IV fent and subsequent had significant associated nausea/vomiting/dry heaving afterwards. Given zofran and scop patch. Resolved now and wants to try FB placement.  ? ?O:  ?BP 127/84   Pulse 83   Temp 98.7 ?F (37.1 ?C) (Oral)   Resp 18   Ht 5\' 3"  (1.6 m)   Wt 75.8 kg   LMP 03/10/2021   SpO2 100%   BMI 29.58 kg/m?  ?EFM: 130/mod/15x15/none  ? ?CVE: Dilation: 2 ?Effacement (%): Thick ?Station: -3 ?Presentation: Vertex ?Exam by:: Dr [email or web address] ? ? ?A&P: 21 y.o. G1P0000 [redacted]w[redacted]d  ?#Labor: After verbal consent, placed FB with ease. Patient tolerated well. Plan for cytotec as well.  ?#Pain: PRN, avoid further IV fent due to N/V response.  ?#FWB: Cat I  ?#GBS negative ? ?[redacted]w[redacted]d, DO ?4:52 PM  ?

## 2021-12-14 NOTE — MAU Note (Addendum)
Pt says at 2330- wen to b-room- fluid came gushing out- then showered . ?No fluid coming out now  ?P[facility] - [facility]  ?VE-did tell pt  ?Denies HSV ?GBS- neg ?No UC's ?

## 2021-12-14 NOTE — H&P (Signed)
LABOR AND DELIVERY ADMISSION HISTORY AND PHYSICAL NOTE ? ?Carly Bates is a 21 y.o. female G1P0000 with IUP at [redacted]w[redacted]d by LMP presenting for early labor and non-reactive NST at term. She has had two elevated blood pressures in this pregnancy. However, first was normal after next recheck and second was during labor check while uncomfortable.  ?She reports positive fetal movement. She denies leakage of fluid or vaginal bleeding. ? ?Prenatal History/Complications: ?PNC at [facility] ?Pregnancy complications:  ?- Mild intermittent asthma  ?- history of viral myocarditis w/ cardiopulmonary arrest at 66 month old-- no residual difficulty and does not follow with a cardiologist  ? ?Past Medical History: ?Past Medical History:  ?Diagnosis Date  ? Asthma   ? Myocarditis (HCC)   ? ? ?Past Surgical History: ?Past Surgical History:  ?Procedure Laterality Date  ? CARDIAC SURGERY    ? 38 months of age   ? ? ?Obstetrical History: ?OB History   ? ? Gravida  ?1  ? Para  ?0  ? Term  ?0  ? Preterm  ?0  ? AB  ?0  ? Living  ?0  ?  ? ? SAB  ?0  ? IAB  ?0  ? Ectopic  ?0  ? Multiple  ?0  ? Live Births  ?0  ?   ?  ?  ? ? ?Social History: ?Social History  ? ?Socioeconomic History  ? Marital status: Single  ?  Spouse name: Not on file  ? Number of children: Not on file  ? Years of education: Not on file  ? Highest education level: Not on file  ?Occupational History  ? Not on file  ?Tobacco Use  ? Smoking status: Never  ? Smokeless tobacco: Never  ?Vaping Use  ? Vaping Use: Never used  ?Substance and Sexual Activity  ? Alcohol use: No  ? Drug use: No  ? Sexual activity: Yes  ?  Partners: Male  ?  Birth control/protection: None  ?Other Topics Concern  ? Not on file  ?Social History Narrative  ? 8th grade   ?   ? Lives with step-mom, father   ? ?Social Determinants of Health  ? ?Financial Resource Strain: Medium Risk  ? Difficulty of Paying Living Expenses: Somewhat hard  ?Food Insecurity: No Food Insecurity  ? Worried About Programme researcher, broadcasting/film/video in the  Last Year: Never true  ? Ran Out of Food in the Last Year: Never true  ?Transportation Needs: Unmet Transportation Needs  ? Lack of Transportation (Medical): Yes  ? Lack of Transportation (Non-Medical): No  ?Physical Activity: Insufficiently Active  ? Days of Exercise per Week: 3 days  ? Minutes of Exercise per Session: 20 min  ?Stress: Stress Concern Present  ? Feeling of Stress : To some extent  ?Social Connections: Socially Isolated  ? Frequency of Communication with Friends and Family: More than three times a week  ? Frequency of Social Gatherings with Friends and Family: Twice a week  ? Attends Religious Services: Never  ? Active Member of Clubs or Organizations: No  ? Attends Banker Meetings: Never  ? Marital Status: Never married  ? ? ?Family History: ?Family History  ?Problem Relation Age of Onset  ? Hypertension Mother   ? Diabetes Sister   ? Diabetes Maternal Grandmother   ? Cancer Maternal Grandfather   ?     prostate  ? Diabetes Paternal Grandmother   ? ? ?Allergies: ?Allergies  ?Allergen Reactions  ? Penicillins   ?  Has patient had a PCN reaction causing immediate rash, facial/tongue/throat swelling, SOB or lightheadedness with hypotension: Unknown ?Has patient had a PCN reaction causing severe rash involving mucus membranes or skin necrosis: Unknown ?Has patient had a PCN reaction that required hospitalization: Unknown ?Has patient had a PCN reaction occurring within the last 10 years: Unknown ?If all of the above answers are "NO", then may proceed with Cephalosporin use. ?  ? ? ?Medications Prior to Admission  ?Medication Sig Dispense Refill Last Dose  ? albuterol (VENTOLIN HFA) 108 (90 Base) MCG/ACT inhaler Inhale 1-2 puffs into the lungs every 6 (six) hours as needed for wheezing or shortness of breath. 18 g 0   ? aspirin 81 MG EC tablet Take 1 tablet (81 mg total) by mouth daily. Swallow whole. (Patient not taking: Reported on 07/05/2021) 90 tablet 3   ? Blood Pressure Monitor MISC  For regular home bp monitoring during pregnancy (Patient not taking: Reported on 12/09/2021) 1 each 0   ? ondansetron (ZOFRAN) 4 MG tablet Take 1 tablet (4 mg total) by mouth every 8 (eight) hours as needed for nausea or vomiting. 20 tablet 6   ? Prenatal Vit-Fe Fumarate-FA (MULTIVITAMIN-PRENATAL) 27-0.8 MG TABS tablet Take 1 tablet by mouth daily at 12 noon. 30 tablet 0   ? valACYclovir (VALTREX) 1000 MG tablet Take 2 tablets (2,000 mg total) by mouth 2 (two) times daily. X 1 day (Patient not taking: Reported on 12/09/2021) 4 tablet 3   ? ? ? ?Review of Systems  ?All systems reviewed and negative except as stated in HPI ? ?Physical Exam ?Blood pressure 129/85, pulse 93, temperature 98.7 ?F (37.1 ?C), temperature source Oral, resp. rate 18, height 5' 3.5" (1.613 m), weight 76 kg, last menstrual period 03/10/2021, SpO2 100 %. ?General appearance: alert, oriented, NAD ?Lungs: normal respiratory effort ?Heart: regular rate ?Abdomen: soft, non-tender; gravid, FH appropriate for GA ?Extremities: No calf swelling or tenderness ?Presentation: cephalic ?Fetal monitoring: 130 bpm/mod var/one 15x15 accel/no decel   ?Uterine activity: ctx every 10 minutes  ?Dilation: 1 ?Effacement (%): Thick ?Exam by:: E.Edwards,RN ? ?Prenatal labs: ?ABO, Rh: O/Positive/-- (11/14 1111) ?Antibody: Negative (02/27 0945) ?Rubella: 3.38 (11/14 1111) ?RPR: Non Reactive (02/27 0945)  ?HBsAg: Negative (11/14 1111)  ?HIV: Non Reactive (02/27 0945)  ?GC/Chlamydia: NEG ?GBS: --Theda Sers (04/19 1130)  ?2-hr GTT: WNL ?Genetic screening:  LR female  ?Anatomy US: WNL  ? ?Prenatal Transfer Tool  ?Maternal Diabetes: No ?Genetic Screening: Normal ?Maternal Ultrasounds/Referrals: Normal ?Fetal Ultrasounds or other Referrals:  None ?Maternal Substance Abuse:  No ?Significant Maternal Medications:  None ?Significant Maternal Lab Results: Group B Strep negative ? ?No results found for this or any previous visit (from the past 24 hour(s)). ? ?Patient Active Problem  List  ? Diagnosis Date Noted  ? Supervision of normal first pregnancy 06/14/2021  ? H/O Cardiopulmonary arrest with successful resuscitation (HCC) 06/14/2021  ? Mild intermittent asthma, uncomplicated 10/06/2016  ? ? ?Assessment: ?Manaal Mandala is a 21 y.o. G1P0000 at [redacted]w[redacted]d here for early labor/IOL at term due to non-reactive NST with elevated blood pressures (without hypertensive diagnosis).  ? ?#Labor: Plan to allow expectant management initially and then IOL as needed if unchanged or comfortable.  ?#Pain: PRN  ?#FWB: Cat I, reassuring but not reactive  ?#ID: GBS Negative  ?#MOF: breastfeeding  ?#MOC: POPs ?#Circ: NA  ? ?#Elevated blood pressure without diagnosis: One elevated BP in the office with normal recheck and elevated during labor check overnight. WNL on arrival today. No symptoms.  Will obtain baseline pre-e labs and monitor.  ? ?Allayne StackSamantha N Knoxx Boeding ?12/14/2021, 11:16 AM  ?

## 2021-12-14 NOTE — Progress Notes (Signed)
Patient ID: Carly Bates, female   DOB: 2001/05/09, 21 y.o.   MRN: [id number] ?Carly Bates is a 21 y.o. G1P0000 at [redacted]w[redacted]d admitted for early labor and induction of labor due to NRNST, elevated bp in MAU ? ?Subjective: ?comfortable with epidural and having nausea . Denies ha, visual changes, ruq/epigastric pain, n/v.   ? ?Objective: ?BP (!) 121/93   Pulse 77   Temp (!) 97.2 ?F (36.2 ?C) (Oral)   Resp 18   Ht 5\' 3"  (1.6 m)   Wt 75.8 kg   LMP 03/10/2021   SpO2 99%   BMI 29.58 kg/m?  ?No intake/output data recorded. ? ?FHR baseline 130 bpm, Variability: min-mod, Accelerations:present, Decelerations:  Present  earlies ?Toco: q 2-78mins  ? ?SVE:   Dilation: 4.5 ?Effacement (%): 80, 90 ?Station: 0 ?Exam by:: Carly yow RN ? ?Labs: ?Lab Results  ?Component Value Date  ? WBC 7.7 12/14/2021  ? HGB 11.4 (L) 12/14/2021  ? HCT 35.0 (L) 12/14/2021  ? MCV 85.6 12/14/2021  ? PLT 322 12/14/2021  ? ? ?Assessment / Plan: ?IOL d/t NRFHR, elevated bp in MAU, has now had a few more elevated bp's, meets criteria for GHTN, asymptomatic. S/P cytotec x 1 and foley bulb, progressing well on her own ? ?Labor: early ?Fetal Wellbeing:  Category I ?Pain Control:  epidural ?Pre-eclampsia: asymptomatic, bp's stable, and labs stable ?I/D:  GBS neg ?Anticipated MOD: NSVB ? ?12/16/2021 CNM, WHNP-BC ?12/14/2021, 9:32 PM  ?

## 2021-12-15 ENCOUNTER — Encounter (HOSPITAL_COMMUNITY): Payer: Self-pay | Admitting: Family Medicine

## 2021-12-15 DIAGNOSIS — O134 Gestational [pregnancy-induced] hypertension without significant proteinuria, complicating childbirth: Secondary | ICD-10-CM | POA: Diagnosis not present

## 2021-12-15 DIAGNOSIS — O139 Gestational [pregnancy-induced] hypertension without significant proteinuria, unspecified trimester: Secondary | ICD-10-CM

## 2021-12-15 DIAGNOSIS — Z3A39 39 weeks gestation of pregnancy: Secondary | ICD-10-CM | POA: Diagnosis not present

## 2021-12-15 LAB — CBC
HCT: 34.1 % — ABNORMAL LOW (ref 36.0–46.0)
HCT: 33.6 % — ABNORMAL LOW (ref 36.0–46.0)
Hemoglobin: 10.9 g/dL — ABNORMAL LOW (ref 12.0–15.0)
Hemoglobin: 10.8 g/dL — ABNORMAL LOW (ref 12.0–15.0)
MCH: 27.8 pg (ref 26.0–34.0)
MCH: 27.5 pg (ref 26.0–34.0)
MCHC: 32 g/dL (ref 30.0–36.0)
MCHC: 32.1 g/dL (ref 30.0–36.0)
MCV: 87 fL (ref 80.0–100.0)
MCV: 85.5 fL (ref 80.0–100.0)
Platelets: 332 10*3/uL (ref 150–400)
Platelets: 301 10*3/uL (ref 150–400)
RBC: 3.92 MIL/uL (ref 3.87–5.11)
RBC: 3.93 MIL/uL (ref 3.87–5.11)
RDW: 13.9 % (ref 11.5–15.5)
RDW: 13.9 % (ref 11.5–15.5)
WBC: 14.1 10*3/uL — ABNORMAL HIGH (ref 4.0–10.5)
WBC: 15.6 10*3/uL — ABNORMAL HIGH (ref 4.0–10.5)
nRBC: 0 % (ref 0.0–0.2)
nRBC: 0 % (ref 0.0–0.2)

## 2021-12-15 LAB — DIC (DISSEMINATED INTRAVASCULAR COAGULATION)PANEL
D-Dimer, Quant: 6.1 ug/mL-FEU — ABNORMAL HIGH (ref 0.00–0.50)
Fibrinogen: 490 mg/dL — ABNORMAL HIGH (ref 210–475)
INR: 1.1 (ref 0.8–1.2)
Platelets: 310 10*3/uL (ref 150–400)
Prothrombin Time: 14.3 seconds (ref 11.4–15.2)
Smear Review: NONE SEEN
aPTT: 27 seconds (ref 24–36)

## 2021-12-15 MED ORDER — ONDANSETRON HCL 4 MG/2ML IJ SOLN
4.0000 mg | INTRAMUSCULAR | Status: DC | PRN
Start: 2021-12-15 — End: 2021-12-16

## 2021-12-15 MED ORDER — IBUPROFEN 600 MG PO TABS
600.0000 mg | ORAL_TABLET | Freq: Four times a day (QID) | ORAL | Status: DC
  Administered 2021-12-15 – 2021-12-16 (×3): 600 mg via ORAL
  Filled 2021-12-15 (×5): qty 1

## 2021-12-15 MED ORDER — BISACODYL 10 MG RE SUPP: 10.0000 mg | Freq: Every day | RECTAL | Status: DC | PRN

## 2021-12-15 MED ORDER — LACTATED RINGERS IV SOLN: INTRAVENOUS | Status: DC

## 2021-12-15 MED ORDER — MISOPROSTOL 200 MCG PO TABS
200.0000 ug | ORAL_TABLET | Freq: Once | ORAL | Status: AC
  Administered 2021-12-15: 200 ug via BUCCAL

## 2021-12-15 MED ORDER — SODIUM CHLORIDE 0.9 % IV SOLN: 250.0000 mL | INTRAVENOUS | Status: DC | PRN

## 2021-12-15 MED ORDER — MISOPROSTOL 200 MCG PO TABS: 200.0000 ug | ORAL_TABLET | Freq: Once | ORAL | Status: DC

## 2021-12-15 MED ORDER — FLEET ENEMA 7-19 GM/118ML RE ENEM: 1.0000 | ENEMA | Freq: Every day | RECTAL | Status: DC | PRN

## 2021-12-15 MED ORDER — METHYLERGONOVINE MALEATE 0.2 MG/ML IJ SOLN
INTRAMUSCULAR | Status: AC
  Administered 2021-12-15: 0.2 mg
  Filled 2021-12-15: qty 1

## 2021-12-15 MED ORDER — MISOPROSTOL 200 MCG PO TABS: 400.0000 ug | ORAL_TABLET | Freq: Once | ORAL | Status: AC

## 2021-12-15 MED ORDER — DIBUCAINE (PERIANAL) 1 % EX OINT
1.0000 | TOPICAL_OINTMENT | CUTANEOUS | Status: DC | PRN
Start: 2021-12-15 — End: 2021-12-16

## 2021-12-15 MED ORDER — CLINDAMYCIN PHOSPHATE 900 MG/50ML IV SOLN
900.0000 mg | Freq: Once | INTRAVENOUS | Status: AC
  Administered 2021-12-15: 900 mg via INTRAVENOUS
  Filled 2021-12-15: qty 50

## 2021-12-15 MED ORDER — MEASLES, MUMPS & RUBELLA VAC IJ SOLR: 0.5000 mL | Freq: Once | INTRAMUSCULAR | Status: DC

## 2021-12-15 MED ORDER — MISOPROSTOL 200 MCG PO TABS
ORAL_TABLET | ORAL | Status: AC
  Administered 2021-12-15: 400 ug via RECTAL
  Filled 2021-12-15: qty 4

## 2021-12-15 MED ORDER — FAMOTIDINE IN NACL 20-0.9 MG/50ML-% IV SOLN
20.0000 mg | Freq: Once | INTRAVENOUS | Status: AC
  Administered 2021-12-15: 20 mg via INTRAVENOUS
  Filled 2021-12-15: qty 50

## 2021-12-15 MED ORDER — COCONUT OIL OIL: 1.0000 "application " | TOPICAL_OIL | Status: DC | PRN

## 2021-12-15 MED ORDER — PRENATAL MULTIVITAMIN CH
1.0000 | ORAL_TABLET | Freq: Every day | ORAL | Status: DC
  Administered 2021-12-15 – 2021-12-16 (×2): 1 via ORAL
  Filled 2021-12-15 (×2): qty 1

## 2021-12-15 MED ORDER — SENNOSIDES-DOCUSATE SODIUM 8.6-50 MG PO TABS
2.0000 | ORAL_TABLET | Freq: Every day | ORAL | Status: DC
  Administered 2021-12-16: 2 via ORAL
  Filled 2021-12-15: qty 2

## 2021-12-15 MED ORDER — LACTATED RINGERS IV SOLN: INTRAVENOUS | Status: AC

## 2021-12-15 MED ORDER — SODIUM CHLORIDE 0.9% FLUSH
3.0000 mL | Freq: Two times a day (BID) | INTRAVENOUS | Status: DC
Start: 2021-12-15 — End: 2021-12-16

## 2021-12-15 MED ORDER — METHYLERGONOVINE MALEATE 0.2 MG/ML IJ SOLN: 0.2000 mg | Freq: Once | INTRAMUSCULAR | Status: DC

## 2021-12-15 MED ORDER — ACETAMINOPHEN 325 MG PO TABS: 650.0000 mg | ORAL_TABLET | ORAL | Status: DC | PRN

## 2021-12-15 MED ORDER — SIMETHICONE 80 MG PO CHEW: 80.0000 mg | CHEWABLE_TABLET | ORAL | Status: DC | PRN

## 2021-12-15 MED ORDER — TRANEXAMIC ACID-NACL 1000-0.7 MG/100ML-% IV SOLN
INTRAVENOUS | Status: AC
  Administered 2021-12-15: 1000 mg
  Filled 2021-12-15: qty 100

## 2021-12-15 MED ORDER — LACTATED RINGERS IV BOLUS: 1000.0000 mL | Freq: Once | INTRAVENOUS | Status: DC

## 2021-12-15 MED ORDER — BENZOCAINE-MENTHOL 20-0.5 % EX AERO: 1.0000 "application " | INHALATION_SPRAY | CUTANEOUS | Status: DC | PRN

## 2021-12-15 MED ORDER — NIFEDIPINE ER OSMOTIC RELEASE 30 MG PO TB24
30.0000 mg | ORAL_TABLET | Freq: Every day | ORAL | Status: DC
  Administered 2021-12-15: 30 mg via ORAL
  Filled 2021-12-15 (×2): qty 1

## 2021-12-15 MED ORDER — ONDANSETRON HCL 4 MG PO TABS: 4.0000 mg | ORAL_TABLET | ORAL | Status: DC | PRN

## 2021-12-15 MED ORDER — GLYCOPYRROLATE 0.2 MG/ML IJ SOLN
0.1000 mg | INTRAMUSCULAR | Status: DC
Start: 2021-12-15 — End: 2021-12-15

## 2021-12-15 MED ORDER — TRANEXAMIC ACID-NACL 1000-0.7 MG/100ML-% IV SOLN: 1000.0000 mg | INTRAVENOUS | Status: DC

## 2021-12-15 MED ORDER — DIPHENHYDRAMINE HCL 25 MG PO CAPS: 25.0000 mg | ORAL_CAPSULE | Freq: Four times a day (QID) | ORAL | Status: DC | PRN

## 2021-12-15 MED ORDER — ZOLPIDEM TARTRATE 5 MG PO TABS: 5.0000 mg | ORAL_TABLET | Freq: Every evening | ORAL | Status: DC | PRN

## 2021-12-15 MED ORDER — SODIUM CHLORIDE 0.9% FLUSH: 3.0000 mL | INTRAVENOUS | Status: DC | PRN

## 2021-12-15 MED ORDER — GLYCOPYRROLATE 0.2 MG/ML IJ SOLN
0.1000 mg | Freq: Three times a day (TID) | INTRAMUSCULAR | Status: DC | PRN
  Filled 2021-12-15: qty 0.5

## 2021-12-15 MED ORDER — WITCH HAZEL-GLYCERIN EX PADS: 1.0000 "application " | MEDICATED_PAD | CUTANEOUS | Status: DC | PRN

## 2021-12-15 MED ORDER — TETANUS-DIPHTH-ACELL PERTUSSIS 5-2.5-18.5 LF-MCG/0.5 IM SUSY: 0.5000 mL | PREFILLED_SYRINGE | Freq: Once | INTRAMUSCULAR | Status: DC

## 2021-12-15 NOTE — Lactation Note (Signed)
This note was copied from a baby's chart. ?Lactation Consultation Note ? ?Patient Name: Carly Bates ?Today's Date: 12/15/2021 ?Reason for consult: Initial assessment;Primapara;1st time breastfeeding;Term;Other (Comment) (per mom plans to breast and bottle. mom falling asleep talking to [facility] . LC reviewed and updated the doc flow sheets and dad helped.) ?Age:20 hours ?LC recommended to call with feeding cues for assistance to latch when baby is showing feeding cues.  ? ?Maternal Data ?  ? ?Feeding ?Mother's Current Feeding Choice: Breast Milk and Formula ?Nipple Type: Slow - flow ? ?LATCH Score ?  ? ?  ? ?  ? ?  ? ?  ? ?  ? ? ?Lactation Tools Discussed/Used ?  ? ?Interventions ?  ? ?Discharge ?  ? ?Consult Status ?Consult Status: Follow-up ?Date: 12/15/21 ?Follow-up type: In-patient ? ? ? ?Matilde Sprang Khushi Zupko ?12/15/2021, 9:42 AM ? ? ? ?

## 2021-12-15 NOTE — Anesthesia Postprocedure Evaluation (Signed)
Anesthesia Post Note ? ?Patient: Carly Bates ? ?Procedure(s) Performed: AN AD HOC LABOR EPIDURAL ? ?  ? ?Patient location during evaluation: Mother Baby ?Anesthesia Type: Epidural ?Level of consciousness: awake, oriented and awake and alert ?Pain management: pain level controlled ?Vital Signs Assessment: post-procedure vital signs reviewed and stable ?Respiratory status: spontaneous breathing, respiratory function stable and nonlabored ventilation ?Cardiovascular status: stable ?Postop Assessment: no headache, adequate PO intake, able to ambulate, patient able to bend at knees and no apparent nausea or vomiting ?Anesthetic complications: no ? ? ?No notable events documented. ? ?Last Vitals:  ?Vitals:  ? 12/15/21 0512 12/15/21 0620  ?BP: (!) 144/98 (!) 142/97  ?Pulse: 63 63  ?Resp: 18 18  ?Temp: 37.6 ?C 37.2 ?C  ?SpO2: 100% 100%  ?  ?Last Pain:  ?Vitals:  ? 12/15/21 0720  ?TempSrc:   ?PainSc: 0-No pain  ? ?Pain Goal: Patients Stated Pain Goal: 10 (12/14/21 1234) ? ?  ?  ?  ?  ?  ?  ?  ? ?Carly Bates ? ? ? ? ?

## 2021-12-15 NOTE — Discharge Summary (Addendum)
Postpartum Discharge Summary      Patient Name: Carly Bates DOB: [birth year] MRN: [id number]  Date of admission: 12/14/2021 Delivery date:12/15/2021  Delivering provider: Wells Guiles R  Date of discharge: 12/16/2021  Admitting diagnosis: Indication for care in labor and delivery, antepartum [O75.9] Intrauterine pregnancy: [redacted]w[redacted]d    Secondary diagnosis:  Principal Problem:   Indication for care in labor and delivery, antepartum Active Problems:   Gestational hypertension  Additional problems: none    Discharge diagnosis: Term Pregnancy Delivered, Gestational Hypertension, and [place]                                             Post partum procedures: none Augmentation: AROM, Cytotec, and IP Foley Complications: [id number] Hospital course: Induction of Labor With Vaginal Delivery   21y.o. yo G1P0000 at 429w0das admitted to the hospital 12/14/2021 for induction of labor.  Indication for induction:  early labor, NRNST, GHTN dx intrapartum .  [facility] Membrane Rupture Time/Date: 10:33 PM ,12/14/2021   Delivery Method:Vaginal, Spontaneous  Episiotomy: None  Lacerations:  1st degree;Labial;Sulcus  Details of delivery can be found in separate delivery note.  Patient had a postpartum course remarkable for being started on ProcardiaXL 3025mPatient is discharged home 12/16/21 per her request for early d/c as long as the baby can go as well.  Newborn Data: Birth date:12/15/2021  Birth time:1:19 AM  Gender:Female  Living status:Living  Apgars:9 ,9  Weight:2920 g (6lb 7oz)  Magnesium Sulfate received: No BMZ received: No Rhophylac:N/A MMR:N/A T-DaP:Given prenatally Flu: N/A Transfusion:No  Physical exam  Vitals:   12/15/21 1455 12/15/21 1942 12/16/21 0512 12/16/21 1023  BP: 111/64 109/63 99/64 102/69  Pulse: 77 77 78 88  Resp: '18 18 18 16  ' Temp: 98.1 F (36.7 C) 98.5 F (36.9 C)     TempSrc: Oral Axillary    SpO2:  100% 100%   Weight:      Height:       General: alert and cooperative Lochia: appropriate Uterine Fundus: firm Incision: N/A DVT Evaluation: No evidence of DVT seen on physical exam. Labs: Lab Results  Component Value Date   WBC 15.6 (H) 12/15/2021   HGB 10.8 (L) 12/15/2021   HCT 33.6 (L) 12/15/2021   MCV 85.5 12/15/2021   PLT 301 12/15/2021      Latest Ref Rng & Units 12/14/2021   11:26 AM  CMP  Glucose 70 - 99 mg/dL 85    BUN 6 - 20 mg/dL <5    Creatinine 0.44 - 1.00 mg/dL 0.82    Sodium 135 - 145 mmol/L 141    Potassium 3.5 - 5.1 mmol/L 3.6    Chloride 98 - 111 mmol/L 112    CO2 22 - 32 mmol/L 23    Calcium 8.9 - 10.3 mg/dL 9.2    Total Protein 6.5 - 8.1 g/dL 6.5    Total Bilirubin 0.3 - 1.2 mg/dL 1.0    Alkaline Phos 38 - 126 U/L 190    AST 15 - 41 U/L 17    ALT 0 - 44 U/L 9     Edinburgh Score:    12/16/2021    7:00 AM  Edinburgh Postnatal Depression Scale Screening Tool  I have been able to laugh and see the funny side of things.  0  I have looked forward with enjoyment to things. 0  I have blamed myself unnecessarily when things went wrong. 1  I have been anxious or worried for no good reason. 1  I have felt scared or panicky for no good reason. 0  Things have been getting on top of me. 0  I have been so unhappy that I have had difficulty sleeping. 0  I have felt sad or miserable. 0  I have been so unhappy that I have been crying. 0  The thought of harming myself has occurred to me. 0  Edinburgh Postnatal Depression Scale Total 2     After visit meds:  Allergies as of 12/16/2021       Reactions   Penicillins Anaphylaxis   Has patient had a PCN reaction causing immediate rash, facial/tongue/throat swelling, SOB or lightheadedness with hypotension: Unknown Has patient had a PCN reaction causing severe rash involving mucus membranes or skin necrosis: Unknown Has patient had a PCN reaction that required hospitalization:  Unknown Has patient had a PCN reaction occurring within the last 10 years: Unknown If all of the above answers are "NO", then may proceed with Cephalosporin use.        Medication List     STOP taking these medications    aspirin EC 81 MG tablet   Blood Pressure Monitor Misc   ondansetron 4 MG tablet Commonly known as: ZOFRAN   valACYclovir 1000 MG tablet Commonly known as: Valtrex       TAKE these medications    albuterol 108 (90 Base) MCG/ACT inhaler Commonly known as: VENTOLIN HFA Inhale 1-2 puffs into the lungs every 6 (six) hours as needed for wheezing or shortness of breath.   FeroSul 325 (65 FE) MG tablet Generic drug: ferrous sulfate Take 1 tablet (325 mg total) by mouth every other day.   ibuprofen 600 MG tablet Commonly known as: ADVIL Take 1 tablet (600 mg total) by mouth every 6 (six) hours as needed.   multivitamin-prenatal 27-0.8 MG Tabs tablet Take 1 tablet by mouth daily at 12 noon.   NIFEdipine 30 MG 24 hr tablet Commonly known as: ADALAT CC Take 1 tablet (30 mg total) by mouth daily.         Discharge home in stable condition Infant Feeding: Breast Infant Disposition:home with mother Discharge instruction: per After Visit Summary and Postpartum booklet. Activity: Advance as tolerated. Pelvic rest for 6 weeks.  Diet: routine diet Future Appointments: Future Appointments  Date Time Provider [place]  12/22/2021 10:30 AM [facility] NURSE [facility]  02/02/2022  1:30 PM Myrtis Ser, CNM [facility]   Follow up Visit: Roma Schanz, CNM  Gloris [place] Please schedule this patient for PP visit in: bp check 1wk, 4-6wks pp visit  Low risk pregnancy complicated by: none  Delivery mode:  SVD  Anticipated Birth Control:  other/unsure  PP Procedures needed: BP check  Schedule Integrated [place] visit: no  Provider: CNM  12/20/2021 Myrtis Ser, CNM

## 2021-12-15 NOTE — Progress Notes (Signed)
Called to evaluate patient due to bleeding. At the time of my arrival Carly Bates CNM had already done uterine sweeps, emptied her bladder, and done TXA, miso and methergine for uterotonics. She has asthma so Hemabate was not given.  ? ?I performed an inspection of the cervix and the full circumference was seen - no laceration noted that was bleeding. Her bleeding was not brisk at this point. I checked the LUS and small pieces of placenta removed from anterior LUS wall - total of about 1-2 cm of tissue. Uterine wall felt smooth on exam. Bimanual massage done and uterus remained firm during this time.  ? ?Would continue PO methergine and will do pitocin for another bag. Also will do Ancef given uterine sweep x3.  ? ?Milas Hock, MD ?Attending Obstetrician & Gynecologist, Faculty Practice ?Center for [employer], [facility] ? ? ?

## 2021-12-16 ENCOUNTER — Other Ambulatory Visit (HOSPITAL_COMMUNITY): Payer: Self-pay

## 2021-12-16 ENCOUNTER — Other Ambulatory Visit: Payer: Medicaid Other | Admitting: Advanced Practice Midwife

## 2021-12-16 MED ORDER — NIFEDIPINE ER 30 MG PO TB24
30.0000 mg | ORAL_TABLET | Freq: Every day | ORAL | 3 refills | Status: DC
  Filled 2021-12-16: qty 30, 30d supply, fill #0

## 2021-12-16 MED ORDER — FERROUS SULFATE 325 (65 FE) MG PO TABS: 325.0000 mg | ORAL_TABLET | ORAL | Status: DC

## 2021-12-16 MED ORDER — FERROUS FUMARATE 150 MG PO TABS
1.0000 | ORAL_TABLET | ORAL | 3 refills | Status: DC
  Filled 2021-12-16: qty 30, 60d supply, fill #0

## 2021-12-16 MED ORDER — IBUPROFEN 600 MG PO TABS
600.0000 mg | ORAL_TABLET | Freq: Four times a day (QID) | ORAL | 0 refills | Status: DC | PRN
  Filled 2021-12-16: qty 30, 8d supply, fill #0

## 2021-12-16 MED ORDER — FERROUS SULFATE 325 (65 FE) MG PO TABS
325.0000 mg | ORAL_TABLET | ORAL | 3 refills | Status: DC
  Filled 2021-12-16: qty 15, 30d supply, fill #0

## 2021-12-16 NOTE — Lactation Note (Signed)
This note was copied from a baby's chart. Lactation Consultation Note Mom holding baby STS. Had given formula 1 hr ago. Mom stated she didn't know what she is doing and didn't know how to BF. LC placed baby in cradle position. Baby pulling to much on breast and mom having hard time latching. Placed baby in football hold. A good learning how to BF position. Mom has good everted nipples that baby latched well. Mom denies painful latch. Newborn feeding habits, STS, I&O, supply and demand reviewed. Mom encouraged to feed baby 8-12 times/24 hours and with feeding cues.   Mom stated she had no further questions. Encouraged to put baby on the breast before giving formula. Encouraged to call for assistance as needed.  Patient Name: Carly Bates [id number] Date: 12/16/2021 Reason for consult: Follow-up assessment;Primapara;Term Age:45 hours  Maternal Data Has patient been taught Hand Expression?: Yes Does the patient have breastfeeding experience prior to this delivery?: No  Feeding    LATCH Score Latch: Grasps breast easily, tongue down, lips flanged, rhythmical sucking.  Audible Swallowing: A few with stimulation  Type of Nipple: Everted at rest and after stimulation  Comfort (Breast/Nipple): Soft / non-tender  Hold (Positioning): Assistance needed to correctly position infant at breast and maintain latch.  LATCH Score: 8   Lactation Tools Discussed/Used    Interventions Interventions: Breast feeding basics reviewed;Assisted with latch;Skin to skin;Breast massage;Hand express;Breast compression;Adjust position;Support pillows;Position options  Discharge    Consult Status Consult Status: Follow-up Date: 12/17/21 Follow-up type: In-patient    Edrik Rundle, Diamond Nickel 12/16/2021, 4:04 AM

## 2021-12-16 NOTE — Progress Notes (Signed)
Called by RN due to low blood pressures and due for procardia dose. Already seen by Derrill Memo this am and discharged.   She had elevated blood pressures shortly after delivery in the setting of gestational hypertension and methergine given for bleeding. Received procardia once yesterday early am.  BP since then systolic [id number] and diastolic [id number]. Continue with baby scripts program and prescription of procardia. However, hold taking it home for now unless BP increases.   Carly Clan, DO

## 2021-12-16 NOTE — Lactation Note (Signed)
This note was copied from a baby's chart. Lactation Consultation Note  Patient Name: Girl Inger Dandrea [id number] Date: 12/16/2021 Reason for consult: Follow-up assessment;1st time breastfeeding;Primapara;Term;Infant weight loss;Other (Comment) (3 % weight loss. per mom baby recently BF 25 mins. baby asleep. LC updated the doc flow sheets per mom. LC reviewed Breast feeding basics and D/C teaching completed. Mom aware of the [facility] resources after D/C.) Age:29 hours  Maternal Data    Feeding Mother's Current Feeding Choice: Breast Milk and Formula Nipple Type: Nfant Standard Flow (white)  LATCH Score ( Latch score by the [facility] )  Latch: Grasps breast easily, tongue down, lips flanged, rhythmical sucking.  Audible Swallowing: Spontaneous and intermittent  Type of Nipple: Everted at rest and after stimulation  Comfort (Breast/Nipple): Soft / non-tender  Hold (Positioning): Full assist, staff holds infant at breast  LATCH Score: 8   Lactation Tools Discussed/Used Tools: Pump;Flanges Flange Size: 24;27;Other (comment) (LC checked the the size - #24 F is a good fit for today and #27 F is available for when the milk comes in .) Breast pump type: Manual Pump Education: Milk Storage;Setup, frequency, and cleaning Reason for Pumping: PRN  Interventions Interventions: Breast feeding basics reviewed;Hand pump;Education;LC Services brochure  Discharge Discharge Education: Engorgement and breast care;Warning signs for feeding baby Pump: Manual;DEBP;Personal  Consult Status Consult Status: Complete Date: 12/16/21    Myer Haff 12/16/2021, 11:38 AM

## 2021-12-17 ENCOUNTER — Telehealth: Payer: Self-pay | Admitting: *Deleted

## 2021-12-17 NOTE — Telephone Encounter (Signed)
Transition Care Management Follow-up Telephone Call Date of discharge and from where: 12/16/2021 - [facility] How have you been since you were released from the hospital? "I am alright" Any questions or concerns? No  Items Reviewed: Did the pt receive and understand the discharge instructions provided? Yes  Medications obtained and verified?  N/A Other? No  Any new allergies since your discharge? No  Dietary orders reviewed? No Do you have support at home? Yes    Functional Questionnaire: (I = Independent and D = Dependent) ADLs: I  Bathing/Dressing- I  Meal Prep- I  Eating- I  Maintaining continence- I  Transferring/Ambulation- I  Managing Meds- I  Follow up appointments reviewed:  PCP Hospital f/u appt confirmed? No   Specialist Hospital f/u appt confirmed? Yes  Scheduled to see OBGYN on 12/22/2021 @ 1030 and 02/02/2022 @ 1330. Are transportation arrangements needed? No  If their condition worsens, is the pt aware to call PCP or go to the Emergency Dept.? Yes Was the patient provided with contact information for the PCP's office or ED? Yes Was to pt encouraged to call back with questions or concerns? Yes

## 2021-12-20 ENCOUNTER — Encounter: Payer: Self-pay | Admitting: Family Medicine

## 2021-12-20 DIAGNOSIS — O139 Gestational [pregnancy-induced] hypertension without significant proteinuria, unspecified trimester: Secondary | ICD-10-CM

## 2021-12-21 ENCOUNTER — Ambulatory Visit: Payer: Medicaid Other | Admitting: *Deleted

## 2021-12-21 ENCOUNTER — Inpatient Hospital Stay (HOSPITAL_COMMUNITY)
Admission: AD | Admit: 2021-12-21 | Discharge: 2021-12-21 | Disposition: A | Discharge status: Home / Self Care | Payer: Medicaid Other | Attending: Obstetrics & Gynecology | Admitting: Obstetrics & Gynecology

## 2021-12-21 ENCOUNTER — Ambulatory Visit (INDEPENDENT_AMBULATORY_CARE_PROVIDER_SITE_OTHER): Payer: Medicaid Other | Admitting: *Deleted

## 2021-12-21 DIAGNOSIS — Z7901 Long term (current) use of anticoagulants: Secondary | ICD-10-CM | POA: Diagnosis not present

## 2021-12-21 DIAGNOSIS — O9089 Other complications of the puerperium, not elsewhere classified: Secondary | ICD-10-CM | POA: Diagnosis present

## 2021-12-21 DIAGNOSIS — I10 Essential (primary) hypertension: Secondary | ICD-10-CM | POA: Insufficient documentation

## 2021-12-21 DIAGNOSIS — G43109 Migraine with aura, not intractable, without status migrainosus: Secondary | ICD-10-CM | POA: Insufficient documentation

## 2021-12-21 DIAGNOSIS — O165 Unspecified maternal hypertension, complicating the puerperium: Secondary | ICD-10-CM | POA: Insufficient documentation

## 2021-12-21 DIAGNOSIS — Z0130 Encounter for examination of blood pressure without abnormal findings: Secondary | ICD-10-CM

## 2021-12-21 LAB — URINALYSIS, ROUTINE W REFLEX MICROSCOPIC
Bilirubin Urine: NEGATIVE
Glucose, UA: NEGATIVE mg/dL
Hgb urine dipstick: NEGATIVE
Ketones, ur: NEGATIVE mg/dL
Nitrite: NEGATIVE
Protein, ur: 30 mg/dL — AB
Specific Gravity, Urine: 1.034 — ABNORMAL HIGH (ref 1.005–1.030)
pH: 5 (ref 5.0–8.0)

## 2021-12-21 MED ORDER — METOCLOPRAMIDE HCL 10 MG PO TABS
10.0000 mg | ORAL_TABLET | Freq: Once | ORAL | Status: AC
  Administered 2021-12-21: 10 mg via ORAL
  Filled 2021-12-21: qty 1

## 2021-12-21 MED ORDER — AMLODIPINE BESYLATE 5 MG PO TABS: 5.0000 mg | ORAL_TABLET | Freq: Every day | ORAL | 3 refills | Status: DC

## 2021-12-21 MED ORDER — ACETAMINOPHEN 500 MG PO TABS
1000.0000 mg | ORAL_TABLET | Freq: Once | ORAL | Status: AC
  Administered 2021-12-21: 1000 mg via ORAL
  Filled 2021-12-21: qty 2

## 2021-12-21 MED ORDER — CYCLOBENZAPRINE HCL 5 MG PO TABS
10.0000 mg | ORAL_TABLET | Freq: Once | ORAL | Status: AC
Start: 2021-12-21 — End: 2021-12-21
  Administered 2021-12-21: 10 mg via ORAL
  Filled 2021-12-21: qty 2

## 2021-12-21 NOTE — Addendum Note (Signed)
Addended by: Cheral Marker on: 12/21/2021 03:28 PM   Modules accepted: Orders

## 2021-12-21 NOTE — MAU Note (Signed)
.  Carly Bates is a 21 y.o. at Unknown here in MAU reporting a migraine h/a. Started B/P medicine yesterday and when she took it yesterday and today caused a bad headache. Was seen at the office today and b/p med changed. However, still has bad headache. Took Ibuprofen 600mg  about 3hrs ago. Med did not help headache. Headache is worse when she looks down or bends over LMP: n/a Onset of complaint: yesterday Pain score: 8 Vitals:   12/21/21 2022 12/21/21 2023  BP:  120/85  Pulse: 97   Resp: 17   Temp: 99 F (37.2 C)   SpO2: 99%      FHT:n/a Lab orders placed from triage:  none

## 2021-12-21 NOTE — MAU Provider Note (Signed)
History     CSN: [id number]  Arrival date and time: 12/21/21 2000   Event Date/Time   First Provider Initiated Contact with Patient 12/21/21 2058      Chief Complaint  Patient presents with   Headache   Carly Bates 21 y.o. G1P1001 at 7 days PP presents to MAU with complaints of new onset of headache since 11 am. Pt Attempted 600mg  ibuprofen at 2pm without relief. Also Unrelieved with rest and food. Pt states she had no water intake today, just "1 cup of juice". Headache is Worsened by light and sounds. Pt points to frontal headache 8/10 pain scale. Was seen in the office today for BP check. Previously on Procardia, but was switched to 5mg  Amlodipine today. She took the morning dose of Procardia, but has not started taking yet. BP's normal in MAU today. Patient endorses    dizziness earlier, but none as of current. She also denies blurred vision, epigastric pain and new onset of swelling.     OB History     Gravida  1   Para  1   Term  1   Preterm  0   AB  0   Living  1      SAB  0   IAB  0   Ectopic  0   Multiple  0   Live Births  1           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(HCC)     Past Surgical History:  Procedure Laterality Date   CARDIAC SURGERY     58 months of age     Family History  Problem Relation Age of Onset   Hypertension Mother    Diabetes Sister    Diabetes Maternal Grandmother    Cancer Maternal Grandfather        prostate   Diabetes Paternal Grandmother     Social History   Tobacco Use   Smoking status: Never   Smokeless tobacco: Never  Vaping Use   Vaping Use: Never used  Substance Use Topics   Alcohol use: No   Drug use: No    Allergies:  Allergies  Allergen Reactions   Penicillins Anaphylaxis    Has patient had a PCN reaction causing immediate rash, facial/tongue/throat swelling, SOB or lightheadedness with hypotension: Unknown Has patient had a PCN reaction causing severe rash  involving mucus membranes or skin necrosis: Unknown Has patient had a PCN reaction that required hospitalization: Unknown Has patient had a PCN reaction occurring within the last 10 years: Unknown If all of the above answers are "NO", then may proceed with Cephalosporin use.     Medications Prior to Admission  Medication Sig Dispense Refill Last Dose   ferrous sulfate 325 (65 FE) MG tablet Take 1 tablet (325 mg total) by mouth every other day. 30 tablet 3 12/20/2021   ibuprofen (ADVIL) 600 MG tablet Take 1 tablet (600 mg total) by mouth every 6 (six) hours as needed. 30 tablet 0 12/21/2021 at 1730   albuterol (VENTOLIN HFA) 108 (90 Base) MCG/ACT inhaler Inhale 1-2 puffs into the lungs every 6 (six) hours as needed for wheezing or shortness of breath. (Patient not taking: Reported on 12/21/2021) 18 g 0    amLODipine (NORVASC) 5 MG tablet Take 1 tablet (5 mg total) by mouth daily. 30 tablet 3    Prenatal Vit-Fe Fumarate-FA (MULTIVITAMIN-PRENATAL) 27-0.8 MG TABS tablet Take 1 tablet by mouth daily at  12 noon. (Patient not taking: Reported on 12/21/2021) 30 tablet 0     Review of Systems  Constitutional:  Negative for activity change, appetite change, chills, fatigue and fever.  Eyes:  Positive for photophobia. Negative for pain and visual disturbance.  Respiratory:  Negative for apnea and shortness of breath.   Cardiovascular:  Negative for chest pain and leg swelling.  Gastrointestinal:  Negative for abdominal pain, nausea and vomiting.  Neurological:  Positive for dizziness and headaches. Negative for seizures and light-headedness.  Physical Exam   Blood pressure 120/85, pulse 97, temperature 99 F (37.2 C), resp. rate 17, height 5' 3.5" (1.613 m), weight 67.6 kg, SpO2 99 %, currently breastfeeding.  Physical Exam Vitals and nursing note reviewed.  Constitutional:      General: She is not in acute distress.    Appearance: She is normal weight.  HENT:     Head: Normocephalic.   Cardiovascular:     Rate and Rhythm: Normal rate.  Pulmonary:     Effort: Pulmonary effort is normal. No respiratory distress.  Abdominal:     General: There is no distension.     Palpations: Abdomen is soft.  Musculoskeletal:     Cervical back: Normal range of motion.  Skin:    General: Skin is warm and dry.  Neurological:     Mental Status: She is alert and oriented to person, place, and time.     Motor: No weakness.  Psychiatric:        Mood and Affect: Mood normal.    MAU Course  Procedures Meds ordered this encounter  Medications   cyclobenzaprine (FLEXERIL) tablet 10 mg   acetaminophen (TYLENOL) tablet 1,000 mg   metoCLOPramide (REGLAN) tablet 10 mg   Patient Vitals for the past 24 hrs:  BP Temp Pulse Resp SpO2 Height Weight  12/21/21 2145 112/82 -- 98 -- -- -- --  12/21/21 2130 118/75 -- 98 -- -- -- --  12/21/21 2120 113/84 -- 98 -- -- -- --  12/21/21 2110 118/86 -- (!) 105 -- -- -- --  12/21/21 2038 124/84 -- 96 -- -- -- --  12/21/21 2023 120/85 -- -- -- -- -- --  12/21/21 2022 -- 99 F (37.2 C) 97 17 99 % 5' 3.5" (1.613 m) 67.6 kg     MDM Headache improved with pain management. BPs normotensive during MAU. Pt rates pain 3/10 after meds.    Assessment and Plan  Headache in Postpartum - Headache related to medication. Hypertension being managed in the office. Continue with 5mg  PO amlodipine regime.  - Follow up with [facility] on 12/24/21 for BP  - Return PP precautions reviewed.  - Pt discharged home in stable condition.   12/26/21, CNM  12/21/2021, 8:58 PM

## 2021-12-21 NOTE — Progress Notes (Signed)
Written and verbal d/c instructions given and understanding voiced. 

## 2021-12-21 NOTE — Progress Notes (Addendum)
   NURSE VISIT- BLOOD PRESSURE CHECK  SUBJECTIVE:  Carly Bates is a 21 y.o. G30P1001 female here for BP check. She is postpartum, delivery date 12/15/21     HYPERTENSION ROS:  Pregnant/postpartum:  Severe headaches that don't go away with tylenol/other medicines:  bad headaches for past 2 days but hasn't taken any meds Visual changes (seeing spots/double/blurred vision) No  Severe pain under right breast breast or in center of upper chest No  Severe nausea/vomiting No  Taking medicines as instructed yes    OBJECTIVE:  BP 124/80 (BP Location: Right Arm, Patient Position: Sitting, Cuff Size: Normal)   Pulse 84   Appearance alert, well appearing, and in no distress.  ASSESSMENT: Postpartum  blood pressure check  PLAN: Discussed with Joellyn Haff, CNM, [facility]   Recommendations: new prescription will be sent   Follow-up:  On Friday for BP Check with  nurse    Annamarie Dawley  12/21/2021 2:08 PM   Chart reviewed for nurse visit. Agree with plan of care. Stop nifedipine (bad headaches when taking), start norvasc 5mg , return Fri b/p check w/ nurse- take meds at least 1hr prior to appt Sun, CNM 12/21/2021 3:27 PM

## 2021-12-24 ENCOUNTER — Ambulatory Visit (INDEPENDENT_AMBULATORY_CARE_PROVIDER_SITE_OTHER): Payer: Medicaid Other | Admitting: *Deleted

## 2021-12-24 ENCOUNTER — Encounter: Payer: Self-pay | Admitting: *Deleted

## 2021-12-24 VITALS — BP 126/87 | HR 100 | Ht 63.5 in | Wt 150.0 lb

## 2021-12-24 DIAGNOSIS — Z0130 Encounter for examination of blood pressure without abnormal findings: Secondary | ICD-10-CM

## 2021-12-24 NOTE — Progress Notes (Signed)
   NURSE VISIT- BLOOD PRESSURE CHECK  SUBJECTIVE:  Carly Bates is a 21 y.o. G81P1001 female here for BP check. She is postpartum, delivery date 12/15/21.     HYPERTENSION ROS:  Pregnant/postpartum:  Severe headaches that don't go away with tylenol/other medicines: No  Visual changes (seeing spots/double/blurred vision) No  Severe pain under right breast breast or in center of upper chest No  Severe nausea/vomiting No  Taking medicines as instructed yes    OBJECTIVE:  BP 126/87 (BP Location: Right Arm, Patient Position: Sitting, Cuff Size: Normal)   Pulse 100   Ht 5' 3.5" (1.613 m)   Wt 150 lb (68 kg)   Breastfeeding Yes   BMI 26.15 kg/m 1st BP reading was 124/90 pulse 101. Appearance alert, well appearing, and in no distress.  ASSESSMENT: Postpartum  blood pressure check  PLAN: Discussed with Philipp Deputy, CNM   Recommendations: no changes needed   Follow-up: as scheduled   Malachy Mood  12/24/2021 10:34 AM

## 2022-01-01 ENCOUNTER — Encounter: Payer: Self-pay | Admitting: Obstetrics and Gynecology

## 2022-02-02 ENCOUNTER — Ambulatory Visit: Payer: Medicaid Other | Admitting: Advanced Practice Midwife

## 2022-02-17 ENCOUNTER — Telehealth (INDEPENDENT_AMBULATORY_CARE_PROVIDER_SITE_OTHER): Payer: Medicaid Other | Admitting: Advanced Practice Midwife

## 2022-02-17 MED ORDER — SLYND 4 MG PO TABS: 1.0000 | ORAL_TABLET | Freq: Every day | ORAL | 11 refills | Status: DC

## 2022-02-17 NOTE — Progress Notes (Addendum)
TELEHEALTH VIRTUAL POSTPARTUM VISIT ENCOUNTER NOTE Patient name: Carly Bates MRN [id number]  Date of birth: [birth year]  I connected with patient on 02/17/22 at  3:30 PM EDT by mychart and verified that I am speaking with the correct person using two identifiers. Pt is not currently in our office, she is at home.  The provider is in the office. This was a Pharmacist, community video visit   I discussed the limitations, risks, security and privacy concerns of performing an evaluation and management service by telephone and the availability of in person appointments. I also discussed with the patient that there may be a patient responsible charge related to this service. The patient expressed understanding and agreed to proceed.  Chief Complaint:   Postpartum Care  History of Present Illness:   Carly Bates is a 21 y.o. G59P1001 African American female being seen today for a postpartum visit. She is 8 weeks postpartum following a spontaneous vaginal delivery at 40 gestational weeks. IOL: Yes, for Gestational hypertension. Sent home on Procardia, changed to Norvasc d/t HA. Last dose this am. Anesthesia: epidural.  Laceration: 2nd degree.  Complications: none. Inpatient contraception: no.   Pregnancy complicated by [facility] . Tobacco use: no. Substance use disorder: no. Last pap smear: n/a. Next pap smear due: next May Patient's last menstrual period was 01/23/2022.  Postpartum course has been uncomplicated. Bleeding no bleeding. Bowel function is normal. Bladder function is normal. Urinary incontinence? No, fecal incontinence? No Patient is not sexually active. Last sexual activity: prior to birth.  Desired contraception: POPs. Patient does want a pregnancy in the future.  Desired family size is 2-3 children.   The pregnancy intention screening data noted above was reviewed. Potential methods of contraception were discussed. The patient elected to proceed with POPs  Edinburgh Postpartum Depression Screening:  Negative  Edinburgh Postnatal Depression Scale - 02/17/22 1645       Edinburgh Postnatal Depression Scale:  In the Past 7 Days   I have been able to laugh and see the funny side of things. 0    I have looked forward with enjoyment to things. 0    I have blamed myself unnecessarily when things went wrong. 0    I have been anxious or worried for no good reason. 0    I have felt scared or panicky for no good reason. 0    Things have been getting on top of me. 0    I have been so unhappy that I have had difficulty sleeping. 0    I have felt sad or miserable. 0    I have been so unhappy that I have been crying. 0    The thought of harming myself has occurred to me. 0    Edinburgh Postnatal Depression Scale Total 0            Baby's course has been uncomplicated. Baby is feeding by breast: milk supply adequate. However, would like to increase supply.  Pumps about 2 oz from each breast, but is able to express more. Baby latches very well. Infant has a pediatrician/family doctor? Yes.  Childcare strategy if returning to work/school: working on that.  Pt has material needs met for her and baby: Yes.   Review of Systems:   Pertinent items are noted in HPI Denies Abnormal vaginal discharge w/ itching/odor/irritation, headaches, visual changes, shortness of breath, chest pain, abdominal pain, severe nausea/vomiting, or problems with urination or bowel movements. Pertinent History Reviewed:  Reviewed past medical,surgical, obstetrical and family  history.  Reviewed problem list, medications and allergies. OB History  Gravida Para Term Preterm AB Living  _0 0 0 1  SAB IAB Ectopic Multiple Live Births  0 0 0 0 1    # Outcome Date GA Lbr Len/2nd Weight Sex Delivery Anes PTL Lv  1 Term 12/15/21 60w0d00:50 / 00:29 6 lb 7 oz (2.92 kg) F Vag-Spont EPI  LIV   Physical Assessment:   Vitals:   02/17/22 1922  BP: 114/74  There is no height or weight on file to calculate BMI.         Physical  Examination:   General:  Alert, oriented and cooperative.   Mental Status: Normal mood and affect perceived.   Rest of physical exam deferred due to type of encounter       No results found for this or any previous visit (from the past 24 hour(s)).  Assessment & Plan:  1) Postpartum exam 2) 8 wks s/p spontaneous vaginal delivery 3) breast feeding 4) Depression screening 5) Contraception management: POPs 6)  GHTN:  stop Norvasc; BP check w/RN on Monday  Essential components of care per ACOG recommendations:  1.  Mood and well being:  If positive depression screen, discussed and plan developed.  If using tobacco we discussed reduction/cessation and risk of relapse If current substance abuse, we discussed and referral to local resources was offered.   2. Infant care and feeding:  If breastfeeding, discussed returning to work, pumping, breastfeeding-associated pain, guidance regarding return to fertility while lactating if not using another method. If needed, patient was provided with a letter to be allowed to pump q 2-3hrs to support lactation in a private location with access to a refrigerator to store breastmilk.   Recommended that all caregivers be immunized for flu, pertussis and other preventable communicable diseases If pt does not have material needs met for her/baby, referred to local resources for help obtaining these.  3. Sexuality, contraception and birth spacing Provided guidance regarding sexuality, management of dyspareunia, and resumption of intercourse Discussed avoiding interpregnancy interval <640ms and recommended birth spacing of 18 months  4. Sleep and fatigue Discussed coping options for fatigue and sleep disruption Encouraged family/partner/community support of 4 hrs of uninterrupted sleep to help with mood and fatigue  5. Physical recovery  If pt had a C/S, assessed incisional pain and providing guidance on normal vs prolonged recovery If pt had a laceration,  perineal healing and pain reviewed.  If urinary or fecal incontinence, discussed management and referred to PT or uro/gyn if indicated  Patient is safe to resume physical activity. Discussed attainment of healthy weight.  6.  Chronic disease management Discussed pregnancy complications if any, and their implications for future childbearing and long-term maternal health. Review recommendations for prevention of recurrent pregnancy complications, such as 17 hydroxyprogesterone caproate to reduce risk for recurrent PTB not applicable, or aspirin to reduce risk of preeclampsia yes. Pt had GDM: No. If yes, 2hr GTT scheduled: not applicable. Reviewed medications and non-pregnant dosing including consideration of whether pt is breastfeeding using a reliable resource such as LactMed: not applicable Referred for f/u w/ PCP or subspecialist providers as indicated: not applicable  7. Health maintenance Mammogram at [id number] earlier if indicated Pap smears as indicated  Meds:  Meds ordered this encounter  Medications   Drospirenone (SLYND) 4 MG TABS    Sig: Take 1 tablet by mouth daily.    Dispense:  28 tablet    Refill:  11    Order Specific Question:   Supervising Provider    Answer:   Florian Buff [[id number]]    Follow-up: No follow-ups on file.   No orders of the defined types were placed in this encounter.    I provided 15 minutes of non-face-to-face time during this encounter.  Christin Fudge DNP, CNM 02/17/2022 7:22 PM

## 2022-02-17 NOTE — Patient Instructions (Signed)
Pap Test Why am I having this test? A Pap test, also called a Pap smear, is a screening test to check for signs of: Infection. Cancer of the cervix. The cervix is the lower part of the uterus that opens into the vagina. Changes that may be a sign that cancer is developing (precancerous changes). Women need this test on a regular basis. In general, you should have a Pap test every 3 years until you reach menopause or age 20. Women aged 30-60 may choose to have their Pap test done at the same time as an HPV (human papillomavirus) test every 5 years (instead of every 3 years). Your health care provider may recommend having Pap tests more or less often depending on your medical conditions and past Pap test results. What is being tested? Cervical cells are tested for signs of infection or abnormalities. What kind of sample is taken?  Your health care provider will collect a sample of cells from the surface of your cervix. This will be done using a small cotton swab, plastic spatula, or brush that is inserted into your vagina using a tool called a speculum. This sample is often collected during a pelvic exam, when you are lying on your back on an exam table with your feet in footrests (stirrups). In some cases, fluids (secretions) from the cervix or vagina may also be collected. How do I prepare for this test? Be aware of where you are in your menstrual cycle. If you are menstruating on the day of the test, you may be asked to reschedule. You may need to reschedule if you have a known vaginal infection on the day of the test. Follow instructions from your health care provider about: Changing or stopping your regular medicines. Some medicines can cause abnormal test results, such as vaginal medicines and tetracycline. Avoiding douching 2-3 days before or the day of the test. Tell a health care provider about: Any allergies you have. All medicines you are taking, including vitamins, herbs, eye drops,  creams, and over-the-counter medicines. Any bleeding problems you have. Any surgeries you have had. Any medical conditions you have. Whether you are pregnant or may be pregnant. How are the results reported? Your test results will be reported as either abnormal or normal. What do the results mean? A normal test result means that you do not have signs of cancer of the cervix. An abnormal result may mean that you have: Cancer. A Pap test by itself is not enough to diagnose cancer. You will have more tests done if cancer is suspected. Precancerous changes in your cervix. Inflammation of the cervix. An STI (sexually transmitted infection). A fungal infection. A parasite infection. Talk with your health care provider about what your results mean. In some cases, your health care provider may do more testing to confirm the results. Questions to ask your health care provider Ask your health care provider, or the department that is doing the test: When will my results be ready? How will I get my results? What are my treatment options? What other tests do I need? What are my next steps? Summary In general, women should have a Pap test every 3 years until they reach menopause or age 20. Your health care provider will collect a sample of cells from the surface of your cervix. This will be done using a small cotton swab, plastic spatula, or brush. In some cases, fluids (secretions) from the cervix or vagina may also be collected. This information is not   intended to replace advice given to you by your health care provider. Make sure you discuss any questions you have with your health care provider. Document Revised: 10/16/2020 Document Reviewed: 10/16/2020 Elsevier Patient Education  2023 Elsevier Inc.  

## 2022-02-21 ENCOUNTER — Telehealth (INDEPENDENT_AMBULATORY_CARE_PROVIDER_SITE_OTHER): Payer: Medicaid Other | Admitting: *Deleted

## 2022-02-21 ENCOUNTER — Encounter: Payer: Self-pay | Admitting: *Deleted

## 2022-02-21 VITALS — BP 112/79 | HR 96 | Ht 64.0 in | Wt 155.0 lb

## 2022-02-21 DIAGNOSIS — Z0130 Encounter for examination of blood pressure without abnormal findings: Secondary | ICD-10-CM

## 2022-02-21 NOTE — Progress Notes (Cosign Needed Addendum)
   NURSE VISIT- BLOOD PRESSURE CHECK  I connected withNAME@ on 02/21/2022 by telephone  and verified that I am speaking with the correct person using two identifiers.   I discussed the limitations of evaluation and management by telemedicine. The patient expressed understanding and agreed to proceed.  Nurse is at the office, and patient is at home.  SUBJECTIVE:  Carly Bates is a 21 y.o. G81P1001 female here for BP check. She is postpartum, delivery date 12/15/21.     HYPERTENSION ROS:  Pregnant/postpartum:  Severe headaches that don't go away with tylenol/other medicines: No  Visual changes (seeing spots/double/blurred vision) No  Severe pain under right breast breast or in center of upper chest No  Severe nausea/vomiting No  Taking medicines as instructed yes    OBJECTIVE:  BP 112/79 (BP Location: Right Arm, Patient Position: Sitting, Cuff Size: Normal)   Pulse 96   Ht 5\' 4"  (1.626 m)   Wt 155 lb (70.3 kg)   LMP 01/23/2022   Breastfeeding Yes   BMI 26.61 kg/m   Appearance alert, well appearing, and in no distress. Pt stopped Norvasc 4 days ago per provider instructions.   ASSESSMENT: Postpartum  blood pressure check  PLAN: Discussed with 01/25/2022, CNM   Recommendations: no changes needed   Follow-up: as scheduled   Cathie Beams  02/21/2022 4:35 PM  Chart reviewed for nurse visit. Agree with plan of care.  02/23/2022, Jacklyn Shell 02/22/2022 8:33 AM

## 2022-03-30 ENCOUNTER — Other Ambulatory Visit (HOSPITAL_COMMUNITY): Payer: Self-pay

## 2022-09-07 ENCOUNTER — Encounter: Payer: Self-pay | Admitting: Adult Health

## 2022-09-07 ENCOUNTER — Other Ambulatory Visit (HOSPITAL_COMMUNITY)
Admission: RE | Admit: 2022-09-07 | Discharge: 2022-09-07 | Disposition: A | Discharge status: Home / Self Care | Payer: Medicaid Other | Source: Ambulatory Visit | Attending: Adult Health | Admitting: Adult Health

## 2022-09-07 ENCOUNTER — Ambulatory Visit (INDEPENDENT_AMBULATORY_CARE_PROVIDER_SITE_OTHER): Payer: Medicaid Other | Admitting: Adult Health

## 2022-09-07 VITALS — BP 116/79 | HR 86 | Ht 63.5 in | Wt 182.5 lb

## 2022-09-07 DIAGNOSIS — N898 Other specified noninflammatory disorders of vagina: Secondary | ICD-10-CM

## 2022-09-07 NOTE — Progress Notes (Signed)
  Subjective:     Patient ID: Carly Bates, female   DOB: Apr 26, 2001, 22 y.o.   MRN: [id number]  HPI Carly Bates is a 22 year old black female,single, G1P1001, in complaining of vaginal discharge and vaginal feels swollen.  Review of Systems +vaginal discharge Vaginal feels swollen Denies any itching or burning Reviewed past medical,surgical, social and family history. Reviewed medications and allergies.     Objective:   Physical Exam BP 116/79 (BP Location: Right Arm, Patient Position: Sitting, Cuff Size: Normal)   Pulse 86   Ht 5' 3.5" (1.613 m)   Wt 182 lb 8 oz (82.8 kg)   LMP 08/29/2022   Breastfeeding Yes   BMI 31.82 kg/m  Skin warm and dry.Pelvic: external genitalia is normal in appearance no lesions, vagina: white discharge without odor,urethra has no lesions or masses noted, cervix:smooth and bulbous, uterus: normal size, shape and contour, non tender, no masses felt, adnexa: no masses or tenderness noted. Bladder is non tender and no masses felt. CV swab obtained   Fall risk is low  Upstream - 09/07/22 1452       Pregnancy Intention Screening   Does the patient want to become pregnant in the next year? No    Does the patient's partner want to become pregnant in the next year? No    Would the patient like to discuss contraceptive options today? No      Contraception Wrap Up   Current Method Female Condom    End Method Female Condom            Examination chaperoned by Levy Pupa LPN Assessment:     1. Vaginal discharge Vaginal feels swollen, +discharge, no itching or burning CV swab sent for GC/CHL,trich,BV and yeast     Plan:     Return about 12/20/22 for pap and physical

## 2022-09-09 LAB — CERVICOVAGINAL ANCILLARY ONLY
Bacterial Vaginitis (gardnerella): NEGATIVE
Candida Glabrata: NEGATIVE
Candida Vaginitis: POSITIVE — AB
Chlamydia: NEGATIVE
Comment: NEGATIVE
Comment: NEGATIVE
Comment: NEGATIVE
Comment: NEGATIVE
Comment: NEGATIVE
Comment: NORMAL
Neisseria Gonorrhea: NEGATIVE
Trichomonas: NEGATIVE

## 2022-09-12 ENCOUNTER — Other Ambulatory Visit: Payer: Self-pay | Admitting: Adult Health

## 2022-09-12 MED ORDER — FLUCONAZOLE 150 MG PO TABS: ORAL_TABLET | ORAL | 1 refills | Status: DC

## 2022-09-12 NOTE — Progress Notes (Signed)
+  yeast on vaginal swab will rx diflucan  

## 2022-10-10 ENCOUNTER — Other Ambulatory Visit: Payer: Self-pay | Admitting: Adult Health

## 2022-10-10 MED ORDER — FLUCONAZOLE 150 MG PO TABS: ORAL_TABLET | ORAL | 1 refills | Status: DC

## 2022-10-10 NOTE — Progress Notes (Signed)
Rx diflucan.  

## 2022-12-06 ENCOUNTER — Ambulatory Visit: Payer: Medicaid Other | Admitting: Adult Health

## 2023-01-10 ENCOUNTER — Emergency Department (HOSPITAL_COMMUNITY): Payer: Medicaid Other

## 2023-01-10 ENCOUNTER — Emergency Department (HOSPITAL_COMMUNITY)
Admission: EM | Admit: 2023-01-10 | Discharge: 2023-01-10 | Disposition: A | Discharge status: Home / Self Care | Payer: Medicaid Other | Attending: Emergency Medicine | Admitting: Emergency Medicine

## 2023-01-10 ENCOUNTER — Other Ambulatory Visit: Payer: Self-pay

## 2023-01-10 DIAGNOSIS — J45909 Unspecified asthma, uncomplicated: Secondary | ICD-10-CM | POA: Insufficient documentation

## 2023-01-10 DIAGNOSIS — R000 Tachycardia, unspecified: Secondary | ICD-10-CM | POA: Diagnosis not present

## 2023-01-10 DIAGNOSIS — Z77098 Contact with and (suspected) exposure to other hazardous, chiefly nonmedicinal, chemicals: Secondary | ICD-10-CM | POA: Insufficient documentation

## 2023-01-10 DIAGNOSIS — R059 Cough, unspecified: Secondary | ICD-10-CM | POA: Diagnosis not present

## 2023-01-10 MED ORDER — LIDOCAINE VISCOUS HCL 2 % MT SOLN
15.0000 mL | Freq: Once | OROMUCOSAL | Status: AC
  Administered 2023-01-10: 15 mL via OROMUCOSAL
  Filled 2023-01-10: qty 15

## 2023-01-10 NOTE — ED Notes (Signed)
Pt is a&ox4, warm and dry to touch. Pt states that she has been experiencing a sore throat x 4 days after cleaning with lysol and clorox. Pt has a dry intermittent cough, throat pain 6/10. Pt denies shob or difficulty breathing. Pt is on the bed with family at the bedside.

## 2023-01-10 NOTE — ED Provider Notes (Signed)
[place] EMERGENCY DEPARTMENT AT [facility] Provider Note   CSN: [id number] Arrival date & time: 01/10/23  4098     History  Chief Complaint  Patient presents with   Sore Throat   Chemical Exposure    Carly Bates is a 22 y.o. female history of asthma presented with sore throat after mixing cleaning ingredients while waiting the bathroom 4 days ago.  Patient states she was using Lysol and Clorox when she says she breathed in the chemical fumes.  Since then patient states she has had a scratchy throat but been able to eat and drink.  Patient is tried hot tea to no relief.  Patient states that she feels slightly short of breath however has not needed to use her albuterol inhaler and denies any wheezing.  Patient states that the itchiness in her throat continues and that she has been unable to sleep due to it.  Patient denies chest pain, productive cough/cough, nausea/vomiting, headache, excessive crying/rhinorrhea/diaphoresis/diarrhea, fever  Home Medications Prior to Admission medications   Medication Sig Start Date End Date Taking? Authorizing Provider  albuterol (VENTOLIN HFA) 108 (90 Base) MCG/ACT inhaler Inhale 1-2 puffs into the lungs every 6 (six) hours as needed for wheezing or shortness of breath. 05/02/21   Dartha Lodge, PA-C  fluconazole (DIFLUCAN) 150 MG tablet Take 1 now and 1 in 3 days 10/10/22   [name], NP  Prenatal MV & Min w/FA-DHA (PRENATAL ADULT GUMMY/DHA/FA) 0.4-25 MG CHEW Chew by mouth.    [provider]      Allergies    Penicillins    Review of Systems   Review of Systems See HPI Physical Exam Updated Vital Signs BP 139/83 (BP Location: Left Arm)   Pulse (!) 105   Temp 99.2 F (37.3 C) (Oral)   Resp 20   Ht 5\' 4"  (1.626 m)   Wt 83.9 kg   LMP 12/05/2022   SpO2 100%   BMI 31.76 kg/m  Physical Exam Constitutional:      General: She is not in acute distress. HENT:     Head: Normocephalic.     Nose: Nose  normal. No rhinorrhea.     Mouth/Throat:     Mouth: Mucous membranes are moist.     Pharynx: No posterior oropharyngeal erythema.     Comments: No signs of burns in the oropharynx Tolerating secretions Eyes:     Extraocular Movements: Extraocular movements intact.     Conjunctiva/sclera: Conjunctivae normal.     Pupils: Pupils are equal, round, and reactive to light.     Comments: No lacrimation  Cardiovascular:     Rate and Rhythm: Regular rhythm. Tachycardia present.     Pulses: Normal pulses.     Heart sounds: Normal heart sounds.  Pulmonary:     Effort: Pulmonary effort is normal. No respiratory distress.     Breath sounds: Normal breath sounds.  Skin:    General: Skin is warm and dry.     Comments: No signs of diaphoresis  Neurological:     Mental Status: She is alert.     ED Results / Procedures / Treatments   Labs (all labs ordered are listed, but only abnormal results are displayed) Labs Reviewed - No data to display  EKG None  Radiology DG Chest 1 View  Result Date: 01/10/2023 CLINICAL DATA:  Cleaning bathroom with lifestyle with core ax. Scratchy throat. Cough. EXAM: CHEST  1 VIEW COMPARISON:  Chest radiographs 07/14/2015 and 07/25/2014 FINDINGS:  Cardiac silhouette and mediastinal contours are within normal limits. The lungs are clear. No pleural effusion or pneumothorax. No acute skeletal abnormality. IMPRESSION: No active disease. Electronically Signed   By: Neita Garnet M.D.   On: 01/10/2023 09:00    Procedures Procedures    Medications Ordered in ED Medications  lidocaine (XYLOCAINE) 2 % viscous mouth solution 15 mL (15 mLs Mouth/Throat Given 01/10/23 1610)    ED Course/ Medical Decision Making/ A&P                             Medical Decision Making  Jimeka Balan 21 y.o. presented today for sore throat, chemical exposure. Working DDx that I considered at this time includes, but not limited to, chemical exposure, cholinergic toxicity, airway  compromise, oropharynx burn.  R/o DDx: cholinergic toxicity, airway compromise, oropharynx burn: These are considered less likely due to history of present illness and physical exam findings  Review of prior external notes: 10/10/2022 progress Notes  Unique Tests and My Interpretation:  Chest x-ray: No acute cardiopulmonary changes  Discussion with Independent Historian: None  Discussion of Management of Tests:  None  Risk: Medium: prescription drug management  Risk Stratification Score: none  Plan: Patient presented for sore throat, chemical exposure. On exam patient was in no acute distress and had stable vitals however was slightly tachycardic at 105.  On exam patient was not wheezing or show any signs of respiratory distress.  Patient's oropharynx was moist without erythema or signs of burns.  Patient is able tolerate secretions.  Patient did not show any signs of cholinergic toxicity.  Patient's checks x-ray was unremarkable.  Patient stated that she got the viscous lidocaine she felt much better.  At this time patient is stable to be discharged with outpatient follow-up as she most likely has mild oropharynx irritation to the chlorine gas in the chemical materials.  Patient is tolerating secretions and showing no signs of cholinergic toxicity or airway compromise.  I encouraged patient to follow-up with her primary care provider and to monitor symptoms and if they worsen to return to ER.  Patient was given return precautions. Patient stable for discharge at this time.  Patient verbalized understanding of plan.         Final Clinical Impression(s) / ED Diagnoses Final diagnoses:  Chemical exposure    Rx / DC Orders ED Discharge Orders     None         Remi Deter 01/10/23 0912    Ernie Avena, MD 01/10/23 1149

## 2023-01-10 NOTE — ED Triage Notes (Signed)
Pt. Stated, I was cleaning the bathroom and the fumes from it has left me with a continuous scratchy throat and not able to sleep good. . It was Lysol with Clorax.

## 2023-01-10 NOTE — Discharge Instructions (Signed)
Please follow-up with your primary care provider recent symptoms ER visit.  Today your physical exam and chest x-ray were reassuring and your symptoms should resolve in the next few days.  Please remain hydrated and avoid any coarse foods or hot drinks that may exacerbate your pain.  Please use your albuterol inhaler as prescribed.  If symptoms change or worsen please return to ER.

## 2023-01-18 ENCOUNTER — Other Ambulatory Visit (HOSPITAL_COMMUNITY)
Admission: RE | Admit: 2023-01-18 | Discharge: 2023-01-18 | Disposition: A | Discharge status: Home / Self Care | Payer: Medicaid Other | Source: Ambulatory Visit | Attending: Adult Health | Admitting: Adult Health

## 2023-01-18 ENCOUNTER — Ambulatory Visit (INDEPENDENT_AMBULATORY_CARE_PROVIDER_SITE_OTHER): Payer: Medicaid Other | Admitting: Adult Health

## 2023-01-18 ENCOUNTER — Encounter: Payer: Self-pay | Admitting: Adult Health

## 2023-01-18 VITALS — BP 125/79 | HR 88 | Ht 64.0 in | Wt 186.5 lb

## 2023-01-18 DIAGNOSIS — O3680X0 Pregnancy with inconclusive fetal viability, not applicable or unspecified: Secondary | ICD-10-CM | POA: Diagnosis not present

## 2023-01-18 DIAGNOSIS — Z0000 Encounter for general adult medical examination without abnormal findings: Secondary | ICD-10-CM

## 2023-01-18 DIAGNOSIS — Z3201 Encounter for pregnancy test, result positive: Secondary | ICD-10-CM

## 2023-01-18 DIAGNOSIS — Z3A01 Less than 8 weeks gestation of pregnancy: Secondary | ICD-10-CM | POA: Diagnosis not present

## 2023-01-18 LAB — POCT URINE PREGNANCY: Preg Test, Ur: POSITIVE — AB

## 2023-01-18 NOTE — Progress Notes (Signed)
Patient ID: Carly Bates, female   DOB: 09-Mar-2001, 22 y.o.   MRN: [id number] History of Present Illness: Carly Bates is a 22 year old black female, with SO, G2P1001 in for a well woman gyn exam and pap, and has missed a period and had +HPT. Has some mil nausea and acid reflux.    Current Medications, Allergies, Past Medical History, Past Surgical History, Family History and Social History were reviewed in [employer] record.     Review of Systems: Patient denies any headaches, hearing loss, fatigue, blurred vision, shortness of breath, chest pain, abdominal pain, problems with bowel movements, urination, or intercourse. No joint pain or mood swings.  See HPI for positives.   Physical Exam:BP 125/79 (BP Location: Left Arm, Patient Position: Sitting, Cuff Size: Normal)   Pulse 88   Ht 5\' 4"  (1.626 m)   Wt 186 lb 8 oz (84.6 kg)   LMP 12/05/2022   Breastfeeding Yes   BMI 32.01 kg/m  UPT is +, about 6+2 weeks by LMP with EDD 09/11/23 General:  Well developed, well nourished, no acute distress Skin:  Warm and dry Neck:  Midline trachea, normal thyroid, good ROM, no lymphadenopathy Lungs; Clear to auscultation bilaterally Breast:  No dominant palpable mass, retraction, or nipple discharge Cardiovascular: Regular rate and rhythm Abdomen:  Soft, non tender, no hepatosplenomegaly Pelvic:  External genitalia is normal in appearance, no lesions.  The vagina is normal in appearance. Urethra has no lesions or masses. The cervix is bulbous,pap with GC/CHL performed.  Uterus is felt to be normal size, shape, and contour.  No adnexal masses or tenderness noted.Bladder is non tender, no masses felt. Extremities/musculoskeletal:  No swelling or varicosities noted, no clubbing or cyanosis Psych:  No mood changes, alert and cooperative,seems happy AA is 0 Fall risk I slow    01/18/2023    2:20 PM 09/27/2021   10:07 AM 06/14/2021   10:01 AM  Depression screen PHQ 2/9  Decreased  Interest 0 2 2  Down, Depressed, Hopeless 0 1 0  PHQ - 2 Score 0 3 2  Altered sleeping 0 1 1  Tired, decreased energy 0 2 1  Change in appetite 2 3 1   Feeling bad or failure about yourself  0 1 0  Trouble concentrating 0 2 0  Moving slowly or fidgety/restless 0 0 0  Suicidal thoughts 0 0 0  PHQ-9 Score 2 12 5       01/18/2023    2:21 PM 09/27/2021   10:07 AM 06/14/2021   10:01 AM  GAD 7 : Generalized Anxiety Score  Nervous, Anxious, on Edge 0 1 0  Control/stop worrying 0 1 1  Worry too much - different things 0 1 0  Trouble relaxing 0 1 0  Restless 0 0 0  Easily annoyed or irritable 0 1 1  Afraid - awful might happen 0 0 0  Total GAD 7 Score 0 5 2    Upstream - 01/18/23 1427       Pregnancy Intention Screening   Does the patient want to become pregnant in the next year? N/A    Does the patient's partner want to become pregnant in the next year? N/A    Would the patient like to discuss contraceptive options today? No      Contraception Wrap Up   Current Method Pregnant/Seeking Pregnancy    End Method Pregnant/Seeking Pregnancy    Contraception Counseling Provided No  Examination chaperoned by Malachy Mood LPN     Impression and Plan: 1. Pregnancy examination or test, positive result - POCT urine pregnancy  2. Routine general medical examination at a health care facility Pap sent Pap in 3 years if normal - Cytology - PAP( [facility])  3. Less than [redacted] weeks gestation of pregnancy Take PNV  4. Encounter to determine fetal viability of pregnancy, single or unspecified fetus Return in 2 weeks for dating [name] - US OB Comp Less 14 Wks; Future   Review OB packet Can take TUMS if needed

## 2023-01-23 LAB — CYTOLOGY - PAP
Chlamydia: NEGATIVE
Comment: NEGATIVE
Comment: NORMAL
Diagnosis: NEGATIVE
Neisseria Gonorrhea: NEGATIVE

## 2023-01-30 ENCOUNTER — Other Ambulatory Visit: Payer: Self-pay | Admitting: Adult Health

## 2023-01-31 ENCOUNTER — Other Ambulatory Visit: Payer: Self-pay | Admitting: Adult Health

## 2023-01-31 MED ORDER — ONDANSETRON HCL 4 MG PO TABS: 4.0000 mg | ORAL_TABLET | Freq: Three times a day (TID) | ORAL | 0 refills | Status: DC | PRN

## 2023-01-31 NOTE — Progress Notes (Signed)
Has nausea and vaginal irritation and is pregnant and breastfeeding. Try OTC monistat and try eating small frequent amounts.

## 2023-01-31 NOTE — Progress Notes (Signed)
-  Rx. zofran 

## 2023-02-01 ENCOUNTER — Other Ambulatory Visit (INDEPENDENT_AMBULATORY_CARE_PROVIDER_SITE_OTHER): Payer: Medicaid Other

## 2023-02-01 ENCOUNTER — Ambulatory Visit: Payer: Medicaid Other | Admitting: *Deleted

## 2023-02-01 ENCOUNTER — Other Ambulatory Visit: Payer: Self-pay | Admitting: Adult Health

## 2023-02-01 DIAGNOSIS — O3680X0 Pregnancy with inconclusive fetal viability, not applicable or unspecified: Secondary | ICD-10-CM

## 2023-02-01 DIAGNOSIS — Z3A08 8 weeks gestation of pregnancy: Secondary | ICD-10-CM

## 2023-02-01 DIAGNOSIS — Z3481 Encounter for supervision of other normal pregnancy, first trimester: Secondary | ICD-10-CM

## 2023-02-01 MED ORDER — FLUCONAZOLE 150 MG PO TABS: ORAL_TABLET | ORAL | 0 refills | Status: DC

## 2023-02-01 NOTE — Progress Notes (Signed)
Rx duflucan

## 2023-02-01 NOTE — Progress Notes (Signed)
New OB Intake  I explained I am completing New OB Intake today. We discussed EDD of 09/17/2023, by Ultrasound. Pt is G2P1001. I reviewed her allergies, medications and Medical/Surgical/OB history.    Patient Active Problem List   Diagnosis Date Noted   Encounter to determine fetal viability of pregnancy 01/18/2023   Less than [redacted] weeks gestation of pregnancy 01/18/2023   Routine general medical examination at a health care facility 01/18/2023   Pregnancy examination or test, positive result 01/18/2023   Postpartum hypertension 12/21/2021   Gestational hypertension 12/20/2021   Indication for care in labor and delivery, antepartum 12/14/2021   Supervision of normal first pregnancy 06/14/2021   H/O Cardiopulmonary arrest with successful resuscitation (HCC) 06/14/2021   Mild intermittent asthma, uncomplicated 10/06/2016    Concerns addressed today  Patient informed that the ultrasound is considered a limited obstetric ultrasound and is not intended to be a complete ultrasound exam.  Patient also informed that the ultrasound is not being completed with the intent of assessing for fetal or placental anomalies or any pelvic abnormalities. Explained that the purpose of today's ultrasound is to assess for viability.  Patient acknowledges the purpose of the exam and the limitations of the study.     Explained to patient that I will let the [facility] office know about her ultrasound today and they will reach out to her to get her scheduled for New OB.     Scheryl Marten, RN 02/01/2023  2:59 PM

## 2023-02-03 ENCOUNTER — Encounter: Payer: Self-pay | Admitting: Obstetrics and Gynecology

## 2023-02-03 ENCOUNTER — Other Ambulatory Visit: Payer: Self-pay | Admitting: Obstetrics and Gynecology

## 2023-02-03 DIAGNOSIS — O09891 Supervision of other high risk pregnancies, first trimester: Secondary | ICD-10-CM | POA: Insufficient documentation

## 2023-02-03 DIAGNOSIS — Q249 Congenital malformation of heart, unspecified: Secondary | ICD-10-CM | POA: Insufficient documentation

## 2023-02-03 DIAGNOSIS — I469 Cardiac arrest, cause unspecified: Secondary | ICD-10-CM

## 2023-03-02 ENCOUNTER — Encounter: Payer: Self-pay | Admitting: Women's Health

## 2023-03-08 ENCOUNTER — Encounter: Payer: Self-pay | Admitting: Women's Health

## 2023-03-08 DIAGNOSIS — Z3490 Encounter for supervision of normal pregnancy, unspecified, unspecified trimester: Secondary | ICD-10-CM | POA: Insufficient documentation

## 2023-03-08 DIAGNOSIS — Z8759 Personal history of other complications of pregnancy, childbirth and the puerperium: Secondary | ICD-10-CM | POA: Insufficient documentation

## 2023-03-10 ENCOUNTER — Other Ambulatory Visit: Payer: Self-pay | Admitting: Adult Health

## 2023-03-13 ENCOUNTER — Other Ambulatory Visit: Payer: Self-pay | Admitting: Obstetrics & Gynecology

## 2023-03-13 DIAGNOSIS — Z3682 Encounter for antenatal screening for nuchal translucency: Secondary | ICD-10-CM

## 2023-03-14 ENCOUNTER — Encounter: Payer: Self-pay | Admitting: Women's Health

## 2023-03-14 ENCOUNTER — Ambulatory Visit (INDEPENDENT_AMBULATORY_CARE_PROVIDER_SITE_OTHER): Payer: Medicaid Other

## 2023-03-14 ENCOUNTER — Encounter: Payer: Medicaid Other | Admitting: *Deleted

## 2023-03-14 ENCOUNTER — Ambulatory Visit (INDEPENDENT_AMBULATORY_CARE_PROVIDER_SITE_OTHER): Payer: Medicaid Other | Admitting: Women's Health

## 2023-03-14 VITALS — BP 118/78 | HR 84 | Wt 178.0 lb

## 2023-03-14 DIAGNOSIS — Z3481 Encounter for supervision of other normal pregnancy, first trimester: Secondary | ICD-10-CM | POA: Diagnosis not present

## 2023-03-14 DIAGNOSIS — Z3491 Encounter for supervision of normal pregnancy, unspecified, first trimester: Secondary | ICD-10-CM | POA: Diagnosis not present

## 2023-03-14 DIAGNOSIS — Z113 Encounter for screening for infections with a predominantly sexual mode of transmission: Secondary | ICD-10-CM | POA: Diagnosis not present

## 2023-03-14 DIAGNOSIS — Z3A13 13 weeks gestation of pregnancy: Secondary | ICD-10-CM | POA: Diagnosis not present

## 2023-03-14 DIAGNOSIS — Z8759 Personal history of other complications of pregnancy, childbirth and the puerperium: Secondary | ICD-10-CM | POA: Diagnosis not present

## 2023-03-14 DIAGNOSIS — I469 Cardiac arrest, cause unspecified: Secondary | ICD-10-CM | POA: Diagnosis not present

## 2023-03-14 DIAGNOSIS — Z3682 Encounter for antenatal screening for nuchal translucency: Secondary | ICD-10-CM | POA: Diagnosis not present

## 2023-03-14 DIAGNOSIS — Z3480 Encounter for supervision of other normal pregnancy, unspecified trimester: Secondary | ICD-10-CM | POA: Diagnosis not present

## 2023-03-14 MED ORDER — ASPIRIN 81 MG PO TBEC: 162.0000 mg | DELAYED_RELEASE_TABLET | Freq: Every day | ORAL | 2 refills | Status: DC

## 2023-03-14 NOTE — Patient Instructions (Signed)
Carly Bates, thank you for choosing our office today! We appreciate the opportunity to meet your healthcare needs. You may receive a short survey by mail, e-mail, or through [employer]. If you are happy with your care we would appreciate if you could take just a few minutes to complete the survey questions. We read all of your comments and take your feedback very seriously. Thank you again for choosing our office.  Center for [employer] [facility] Team at [facility]  [facility] at [facility] ([address] [place], [place] [address]) Entrance C, located off of [address] Free 24/7 valet parking   Nausea & Vomiting Have saltine crackers or pretzels by your bed and eat a few bites before you raise your head out of bed in the morning Eat small frequent meals throughout the day instead of large meals Drink plenty of fluids throughout the day to stay hydrated, just don't drink a lot of fluids with your meals.  This can make your stomach fill up faster making you feel sick Do not brush your teeth right after you eat Products with real ginger are good for nausea, like ginger ale and ginger hard candy Make sure it says made with real ginger! Sucking on sour candy like lemon heads is also good for nausea If your prenatal vitamins make you nauseated, take them at night so you will sleep through the nausea Sea Bands If you feel like you need medicine for the nausea & vomiting please let us know If you are unable to keep any fluids or food down please let us know   Constipation Drink plenty of fluid, preferably water, throughout the day Eat foods high in fiber such as fruits, vegetables, and grains Exercise, such as walking, is a good way to keep your bowels regular Drink warm fluids, especially warm prune juice, or decaf coffee Eat a 1/2 cup of real oatmeal (not instant), 1/2 cup applesauce, and 1/2-1 cup warm prune juice every day If needed, you may take Colace (docusate sodium) stool  softener once or twice a day to help keep the stool soft.  If you still are having problems with constipation, you may take Miralax once daily as needed to help keep your bowels regular.   Home Blood Pressure Monitoring for Patients   Your provider has recommended that you check your blood pressure (BP) at least once a week at home. If you do not have a blood pressure cuff at home, one will be provided for you. Contact your provider if you have not received your monitor within 1 week.   Helpful Tips for Accurate Home Blood Pressure Checks  Don't smoke, exercise, or drink caffeine 30 minutes before checking your BP Use the restroom before checking your BP (a full bladder can raise your pressure) Relax in a comfortable upright chair Feet on the ground Left arm resting comfortably on a flat surface at the level of your heart Legs uncrossed Back supported Sit quietly and don't talk Place the cuff on your bare arm Adjust snuggly, so that only two fingertips can fit between your skin and the top of the cuff Check 2 readings separated by at least one minute Keep a log of your BP readings For a visual, please reference this diagram: http://ccnc.care/bpdiagram  Provider Name: [facility]     Phone: [phone]  Zone 1: ALL CLEAR  Continue to monitor your symptoms:  BP reading is less than 140 (top number) or less than 90 (bottom  number)  No right upper stomach pain No headaches or seeing spots No feeling nauseated or throwing up No swelling in face and hands  Zone 2: CAUTION Call your doctor's office for any of the following:  BP reading is greater than 140 (top number) or greater than 90 (bottom number)  Stomach pain under your ribs in the middle or right side Headaches or seeing spots Feeling nauseated or throwing up Swelling in face and hands  Zone 3: EMERGENCY  Seek immediate medical care if you have any of the following:  BP reading is greater than160 (top number) or  greater than 110 (bottom number) Severe headaches not improving with Tylenol Serious difficulty catching your breath Any worsening symptoms from Zone 2    First Trimester of Pregnancy The first trimester of pregnancy is from week 1 until the end of week 12 (months 1 through 3). A week after a sperm fertilizes an egg, the egg will implant on the wall of the uterus. This embryo will begin to develop into a baby. Genes from you and your partner are forming the baby. The female genes determine whether the baby is a boy or a girl. At 6-8 weeks, the eyes and face are formed, and the heartbeat can be seen on ultrasound. At the end of 12 weeks, all the baby's organs are formed.  Now that you are pregnant, you will want to do everything you can to have a healthy baby. Two of the most important things are to get good prenatal care and to follow your health care provider's instructions. Prenatal care is all the medical care you receive before the baby's birth. This care will help prevent, find, and treat any problems during the pregnancy and childbirth. BODY CHANGES Your body goes through many changes during pregnancy. The changes vary from woman to woman.  You may gain or lose a couple of pounds at first. You may feel sick to your stomach (nauseous) and throw up (vomit). If the vomiting is uncontrollable, call your health care provider. You may tire easily. You may develop headaches that can be relieved by medicines approved by your health care provider. You may urinate more often. Painful urination may mean you have a bladder infection. You may develop heartburn as a result of your pregnancy. You may develop constipation because certain hormones are causing the muscles that push waste through your intestines to slow down. You may develop hemorrhoids or swollen, bulging veins (varicose veins). Your breasts may begin to grow larger and become tender. Your nipples may stick out more, and the tissue that  surrounds them (areola) may become darker. Your gums may bleed and may be sensitive to brushing and flossing. Dark spots or blotches (chloasma, mask of pregnancy) may develop on your face. This will likely fade after the baby is born. Your menstrual periods will stop. You may have a loss of appetite. You may develop cravings for certain kinds of food. You may have changes in your emotions from day to day, such as being excited to be pregnant or being concerned that something may go wrong with the pregnancy and baby. You may have more vivid and strange dreams. You may have changes in your hair. These can include thickening of your hair, rapid growth, and changes in texture. Some women also have hair loss during or after pregnancy, or hair that feels dry or thin. Your hair will most likely return to normal after your baby is born. WHAT TO EXPECT AT YOUR PRENATAL  VISITS During a routine prenatal visit: You will be weighed to make sure you and the baby are growing normally. Your blood pressure will be taken. Your abdomen will be measured to track your baby's growth. The fetal heartbeat will be listened to starting around week 10 or 12 of your pregnancy. Test results from any previous visits will be discussed. Your health care provider may ask you: How you are feeling. If you are feeling the baby move. If you have had any abnormal symptoms, such as leaking fluid, bleeding, severe headaches, or abdominal cramping. If you have any questions. Other tests that may be performed during your first trimester include: Blood tests to find your blood type and to check for the presence of any previous infections. They will also be used to check for low iron levels (anemia) and Rh antibodies. Later in the pregnancy, blood tests for diabetes will be done along with other tests if problems develop. Urine tests to check for infections, diabetes, or protein in the urine. An ultrasound to confirm the proper growth  and development of the baby. An amniocentesis to check for possible genetic problems. Fetal screens for spina bifida and Down syndrome. You may need other tests to make sure you and the baby are doing well. HOME CARE INSTRUCTIONS  Medicines Follow your health care provider's instructions regarding medicine use. Specific medicines may be either safe or unsafe to take during pregnancy. Take your prenatal vitamins as directed. If you develop constipation, try taking a stool softener if your health care provider approves. Diet Eat regular, well-balanced meals. Choose a variety of foods, such as meat or vegetable-based protein, fish, milk and low-fat dairy products, vegetables, fruits, and whole grain breads and cereals. Your health care provider will help you determine the amount of weight gain that is right for you. Avoid raw meat and uncooked cheese. These carry germs that can cause birth defects in the baby. Eating four or five small meals rather than three large meals a day may help relieve nausea and vomiting. If you start to feel nauseous, eating a few soda crackers can be helpful. Drinking liquids between meals instead of during meals also seems to help nausea and vomiting. If you develop constipation, eat more high-fiber foods, such as fresh vegetables or fruit and whole grains. Drink enough fluids to keep your urine clear or pale yellow. Activity and Exercise Exercise only as directed by your health care provider. Exercising will help you: Control your weight. Stay in shape. Be prepared for labor and delivery. Experiencing pain or cramping in the lower abdomen or low back is a good sign that you should stop exercising. Check with your health care provider before continuing normal exercises. Try to avoid standing for long periods of time. Move your legs often if you must stand in one place for a long time. Avoid heavy lifting. Wear low-heeled shoes, and practice good posture. You may  continue to have sex unless your health care provider directs you otherwise. Relief of Pain or Discomfort Wear a good support bra for breast tenderness.   Take warm sitz baths to soothe any pain or discomfort caused by hemorrhoids. Use hemorrhoid cream if your health care provider approves.   Rest with your legs elevated if you have leg cramps or low back pain. If you develop varicose veins in your legs, wear support hose. Elevate your feet for 15 minutes, 3-4 times a day. Limit salt in your diet. Prenatal Care Schedule your prenatal visits by the  twelfth week of pregnancy. They are usually scheduled monthly at first, then more often in the last 2 months before delivery. Write down your questions. Take them to your prenatal visits. Keep all your prenatal visits as directed by your health care provider. Safety Wear your seat belt at all times when driving. Make a list of emergency phone numbers, including numbers for family, friends, the hospital, and police and fire departments. General Tips Ask your health care provider for a referral to a local prenatal education class. Begin classes no later than at the beginning of month 6 of your pregnancy. Ask for help if you have counseling or nutritional needs during pregnancy. Your health care provider can offer advice or refer you to specialists for help with various needs. Do not use hot tubs, steam rooms, or saunas. Do not douche or use tampons or scented sanitary pads. Do not cross your legs for long periods of time. Avoid cat litter boxes and soil used by cats. These carry germs that can cause birth defects in the baby and possibly loss of the fetus by miscarriage or stillbirth. Avoid all smoking, herbs, alcohol, and medicines not prescribed by your health care provider. Chemicals in these affect the formation and growth of the baby. Schedule a dentist appointment. At home, brush your teeth with a soft toothbrush and be gentle when you floss. SEEK  MEDICAL CARE IF:  You have dizziness. You have mild pelvic cramps, pelvic pressure, or nagging pain in the abdominal area. You have persistent nausea, vomiting, or diarrhea. You have a bad smelling vaginal discharge. You have pain with urination. You notice increased swelling in your face, hands, legs, or ankles. SEEK IMMEDIATE MEDICAL CARE IF:  You have a fever. You are leaking fluid from your vagina. You have spotting or bleeding from your vagina. You have severe abdominal cramping or pain. You have rapid weight gain or loss. You vomit blood or material that looks like coffee grounds. You are exposed to [name] measles and have never had them. You are exposed to fifth disease or chickenpox. You develop a severe headache. You have shortness of breath. You have any kind of trauma, such as from a fall or a car accident. Document Released: 07/12/2001 Document Revised: 12/02/2013 Document Reviewed: 05/28/2013 [facility] Patient Information 2015 [place], [place]. This information is not intended to replace advice given to you by your health care provider. Make sure you discuss any questions you have with your health care provider.

## 2023-03-14 NOTE — Progress Notes (Signed)
INITIAL OBSTETRICAL VISIT Patient name: Carly Bates MRN [id number]  Date of birth: [birth year] Chief Complaint:   Initial Prenatal Visit  History of Present Illness:   Carly Bates is a 22 y.o. G73P1001 African-American female at [redacted]w[redacted]d by [name] at 7 weeks with an Estimated Date of Delivery: 09/18/23 being seen today for her initial obstetrical visit.   Patient's last menstrual period was 12/05/2022. Her obstetrical history is significant for  term SVB complicated by GHTN and PPHTN, PPH 1L .  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 Feels smooth bulge sometimes when voiding/bm Last pap 01/18/23. Results were: NILM w/ HRHPV not done     03/14/2023    9:23 AM 01/18/2023    2:20 PM 09/27/2021   10:07 AM 06/14/2021   10:01 AM 04/21/2020    3:48 PM  Depression screen PHQ 2/9  Decreased Interest 0 0 2 2 0  Down, Depressed, Hopeless 0 0 1 0 0  PHQ - 2 Score 0 0 3 2 0  Altered sleeping 0 0 1 1   Tired, decreased energy 0 0 2 1   Change in appetite 3 2 3 1    Feeling bad or failure about yourself  0 0 1 0   Trouble concentrating 1 0 2 0   Moving slowly or fidgety/restless 0 0 0 0   Suicidal thoughts 0 0 0 0   PHQ-9 Score 4 2 12 5          03/14/2023    9:23 AM 01/18/2023    2:21 PM 09/27/2021   10:07 AM 06/14/2021   10:01 AM  GAD 7 : Generalized Anxiety Score  Nervous, Anxious, on Edge 0 0 1 0  Control/stop worrying 0 0 1 1  Worry too much - different things 0 0 1 0  Trouble relaxing 0 0 1 0  Restless 0 0 0 0  Easily annoyed or irritable 0 0 1 1  Afraid - awful might happen 0 0 0 0  Total GAD 7 Score 0 0 5 2     Review of Systems:   Pertinent items are noted in HPI Denies cramping/contractions, leakage of fluid, vaginal bleeding, abnormal vaginal discharge w/ itching/odor/irritation, headaches, visual changes, shortness of breath, chest pain, abdominal pain, severe nausea/vomiting, or problems  with urination or bowel movements unless otherwise stated above.  Pertinent History Reviewed:  Reviewed past medical,surgical, social, obstetrical and family history.  Reviewed problem list, medications and allergies. OB History  Gravida Para Term Preterm AB Living  2 1 1  0 0 1  SAB IAB Ectopic Multiple Live Births  0 0 0 0 1    # Outcome Date GA Lbr Len/2nd Weight Sex Type Anes PTL Lv  2 Current           1 Term 12/15/21 [redacted]w[redacted]d 00:50 / 00:29 6 lb 7 oz (2.92 kg) F Vag-Spont EPI  LIV   Physical Assessment:   Vitals:   03/14/23 0958  BP: 118/78  Pulse: 84  Weight: 178 lb (80.7 kg)  Body mass index is 30.55 kg/m.       Physical Examination:  General appearance - well appearing, and in no distress  Mental status - alert, oriented to person, place, and time  Psych:  She has a normal mood and affect  Skin - warm and dry, normal color, no suspicious lesions noted  Chest - effort normal, all lung fields clear  to auscultation bilaterally  Heart - normal rate and regular rhythm  Abdomen - soft, nontender  Extremities:  No swelling or varicosities noted  Pelvic - VULVA: normal appearing vulva with no masses, tenderness or lesions  VAGINA: normal appearing vagina with normal color and discharge, no lesions  CERVIX: normal appearing cervix without discharge or lesions, no CMT. NO pelvic floor prolapse of any kind noted  Thin prep pap is not done   Chaperone: Peggy Dones    TODAY'S NT [name] 13+1 wks,measurements c/w dates,CRL 76.38 mm,NB present,NT 1.5 mm,normal ovaries,FHR 152 BPM,anterior placenta   No results found for this or any previous visit (from the past 24 hour(s)).  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 Swallow whole.    Dispense:  180 tablet    Refill:  2    Initial labs obtained Continue prenatal vitamins Reviewed n/v relief measures and warning s/s to report Reviewed recommended weight gain based on pre-gravid BMI Encouraged well-balanced diet Genetic & carrier screening discussed: requests Panorama and NT/IT, neg Horizon prev preg Ultrasound discussed; fetal survey: requested CCNC completed> form faxed if has or is planning to apply for medicaid The nature of [facility] [employer] with multiple MDs and other Advanced Practice Providers was explained to patient; also emphasized that fellows, residents, and students are part of our team. Does have home bp cuff. Office bp cuff given: no. Rx sent: n/a. Check bp weekly, let us know if consistently >140/90.    Follow-up: Return in about 3 weeks (around 04/04/2023) for LROB, 2nd IT, CNM, in person; then 6wks from now anatomy u/s and LROB w/ CNM.   Orders Placed This Encounter  Procedures   Urine Culture   GC/Chlamydia Probe Amp   CBC/D/Plt+RPR+Rh+ABO+RubIgG...   Integrated 1   PANORAMA PRENATAL TEST   Protein / creatinine ratio, urine   Comp Met (CMET)    Cheral Marker CNM, [facility] 03/14/2023 10:53 AM

## 2023-03-14 NOTE — Progress Notes (Signed)
[name] 13+1 wks,measurements c/w dates,CRL 76.38 mm,NB present,NT 1.5 mm,normal ovaries,FHR 152 BPM,anterior placenta

## 2023-03-20 ENCOUNTER — Ambulatory Visit: Payer: Medicaid Other | Attending: Cardiology | Admitting: Cardiology

## 2023-03-20 ENCOUNTER — Encounter: Payer: Self-pay | Admitting: Cardiology

## 2023-03-24 LAB — PANORAMA PRENATAL TEST FULL PANEL:PANORAMA TEST PLUS 5 ADDITIONAL MICRODELETIONS

## 2023-04-05 ENCOUNTER — Ambulatory Visit (INDEPENDENT_AMBULATORY_CARE_PROVIDER_SITE_OTHER): Payer: Medicaid Other | Admitting: Obstetrics and Gynecology

## 2023-04-05 ENCOUNTER — Encounter: Payer: Self-pay | Admitting: Obstetrics and Gynecology

## 2023-04-05 VITALS — BP 121/76 | HR 100 | Wt 176.0 lb

## 2023-04-05 DIAGNOSIS — Z3480 Encounter for supervision of other normal pregnancy, unspecified trimester: Secondary | ICD-10-CM

## 2023-04-05 DIAGNOSIS — Z3A16 16 weeks gestation of pregnancy: Secondary | ICD-10-CM

## 2023-04-05 DIAGNOSIS — Z8759 Personal history of other complications of pregnancy, childbirth and the puerperium: Secondary | ICD-10-CM

## 2023-04-05 DIAGNOSIS — Z1379 Encounter for other screening for genetic and chromosomal anomalies: Secondary | ICD-10-CM

## 2023-04-05 MED ORDER — ONDANSETRON 4 MG PO TBDP: 4.0000 mg | ORAL_TABLET | Freq: Four times a day (QID) | ORAL | 0 refills | Status: DC | PRN

## 2023-04-05 NOTE — Progress Notes (Signed)
   PRENATAL VISIT NOTE  Subjective:  Carly Bates is a 22 y.o. G2P1001 at 105w2d being seen today for ongoing prenatal care.  She is currently monitored for the following issues for this low-risk pregnancy and has Mild intermittent asthma, uncomplicated; H/O Cardiopulmonary arrest with successful resuscitation (HCC); Short interval between pregnancies affecting pregnancy in first trimester, antepartum; History of gestational hypertension & PPHTN; and Encounter for supervision of normal pregnancy, antepartum on their problem list.  Patient reports no complaints.  Contractions: Not present. Vag. Bleeding: None.  Movement: Absent. Denies leaking of fluid.   The following portions of the patient's history were reviewed and updated as appropriate: allergies, current medications, past family history, past medical history, past social history, past surgical history and problem list.   Objective:   Vitals:   04/05/23 1448  BP: 121/76  Pulse: 100  Weight: 176 lb (79.8 kg)    Fetal Status:   Fundal Height: 151 cm Movement: Absent     General:  Alert, oriented and cooperative. Patient is in no acute distress.  Skin: Skin is warm and dry. No rash noted.   Cardiovascular: Normal heart rate noted  Respiratory: Normal respiratory effort, no problems with respiration noted  Abdomen: Soft, gravid, appropriate for gestational age.  Pain/Pressure: Absent     Pelvic: Cervical exam deferred        Extremities: Normal range of motion.  Edema: None  Mental Status: Normal mood and affect. Normal behavior. Normal judgment and thought content.   Assessment and Plan:  Pregnancy: G2P1001 at [redacted]w[redacted]d 1. Supervision of other normal pregnancy, antepartum BP and FHR normal  Refill on zofran  - INTEGRATED 2  2. [redacted] weeks gestation of pregnancy  - INTEGRATED 2  4. History of gestational hypertension Normotensive, continue ASA   Preterm labor symptoms and general obstetric precautions including but not  limited to vaginal bleeding, contractions, leaking of fluid and fetal movement were reviewed in detail with the patient. Please refer to After Visit Summary for other counseling recommendations.   Return in 4 weeks for routine prenatal   Future Appointments  Date Time Provider Department Center  04/27/2023 11:00 AM [facility] [name] [facility] None  04/27/2023 11:50 AM Jacklyn Shell, CNM [facility]  05/23/2023  3:00 PM Tobb, Lavona [place], DO [facility] None    Albertine Grates, FNP

## 2023-04-07 LAB — INTEGRATED 2
AFP MoM: 1.46
Alpha-Fetoprotein: 47.9 ng/mL
Crown Rump Length: 76.3 mm
DIA MoM: 1
DIA Value: 146.1 pg/mL
Estriol, Unconjugated: 1.02 ng/mL
Gest. Age on Collection Date: 13.4 wk
Gestational Age: 16.6 wk
Maternal Age at EDD: 21.7 a
Nuchal Translucency (NT): 1.5 mm
Nuchal Translucency MoM: 0.85
Number of Fetuses: 1
PAPP-A MoM: 2.5
PAPP-A Value: 2898.1 ng/mL
Test Results:: NEGATIVE
Weight: 178 [lb_av]
Weight: 178 [lb_av]
hCG MoM: 2.05
hCG Value: 59.3 [IU]/mL
uE3 MoM: 1.13

## 2023-04-19 ENCOUNTER — Telehealth: Payer: Self-pay

## 2023-04-19 NOTE — Telephone Encounter (Signed)
Pt reports decreased milk supply and cracked nipples. Advised frequent pumping and using coconut oil on the nipples. Avoid antihistamines. No other questions at this time.

## 2023-04-19 NOTE — Telephone Encounter (Signed)
Patient would like for a nurse to call her. Patient would not say what she wanted, only she wants to ask a medical question

## 2023-04-26 ENCOUNTER — Other Ambulatory Visit: Payer: Self-pay | Admitting: Adult Health

## 2023-04-26 ENCOUNTER — Other Ambulatory Visit: Payer: Self-pay | Admitting: Obstetrics & Gynecology

## 2023-04-26 DIAGNOSIS — Z363 Encounter for antenatal screening for malformations: Secondary | ICD-10-CM

## 2023-04-26 MED ORDER — ONDANSETRON HCL 4 MG PO TABS: 4.0000 mg | ORAL_TABLET | Freq: Three times a day (TID) | ORAL | 1 refills | Status: DC | PRN

## 2023-04-26 NOTE — Telephone Encounter (Signed)
Refilled zofran

## 2023-04-27 ENCOUNTER — Ambulatory Visit: Payer: Medicaid Other

## 2023-04-27 ENCOUNTER — Encounter: Payer: Self-pay | Admitting: Advanced Practice Midwife

## 2023-04-27 ENCOUNTER — Ambulatory Visit: Payer: Medicaid Other | Admitting: Advanced Practice Midwife

## 2023-04-27 VITALS — BP 126/81 | HR 90 | Wt 175.0 lb

## 2023-04-27 DIAGNOSIS — Z8759 Personal history of other complications of pregnancy, childbirth and the puerperium: Secondary | ICD-10-CM

## 2023-04-27 DIAGNOSIS — Z3482 Encounter for supervision of other normal pregnancy, second trimester: Secondary | ICD-10-CM

## 2023-04-27 DIAGNOSIS — Z3A19 19 weeks gestation of pregnancy: Secondary | ICD-10-CM

## 2023-04-27 DIAGNOSIS — Z363 Encounter for antenatal screening for malformations: Secondary | ICD-10-CM

## 2023-04-27 DIAGNOSIS — Z3480 Encounter for supervision of other normal pregnancy, unspecified trimester: Secondary | ICD-10-CM

## 2023-04-27 DIAGNOSIS — O09292 Supervision of pregnancy with other poor reproductive or obstetric history, second trimester: Secondary | ICD-10-CM

## 2023-04-27 DIAGNOSIS — I469 Cardiac arrest, cause unspecified: Secondary | ICD-10-CM

## 2023-04-27 DIAGNOSIS — O09299 Supervision of pregnancy with other poor reproductive or obstetric history, unspecified trimester: Secondary | ICD-10-CM | POA: Insufficient documentation

## 2023-04-27 NOTE — Progress Notes (Signed)
LOW-RISK PREGNANCY VISIT Patient name: Carly Bates MRN [id number]  Date of birth: [birth year] Chief Complaint:   Routine Prenatal Visit (Ultrasound today)  History of Present Illness:   Carly Bates is a 22 y.o. G77P1001 female at [redacted]w[redacted]d with an Estimated Date of Delivery: 09/18/23 being seen today for ongoing management of a low-risk pregnancy.  Today she reports appetite still a little poor, zofran works intermittently [employer], will try that again. . Contractions: Not present. Vag. Bleeding: None.  Movement: Present. denies leaking of fluid. Review of Systems:   Pertinent items are noted in HPI Denies abnormal vaginal discharge w/ itching/odor/irritation, headaches, visual changes, shortness of breath, chest pain, abdominal pain, severe nausea/vomiting, or problems with urination or bowel movements unless otherwise stated above. Pertinent History Reviewed:  Reviewed past medical,surgical, social, obstetrical and family history.  Reviewed problem list, medications and allergies. Physical Assessment:   Vitals:   04/27/23 1105  BP: 126/81  Pulse: 90  Weight: 175 lb (79.4 kg)  Body mass index is 30.04 kg/m.        Physical Examination:   General appearance: Well appearing, and in no distress  Mental status: Alert, oriented to person, place, and time  Skin: Warm & dry  Cardiovascular: Normal heart rate noted  Respiratory: Normal respiratory effort, no distress  Abdomen: Soft, gravid, nontender  Pelvic: Cervical exam deferred         Extremities: Edema: None Chaperone:  N/A   Fetal Status:     Movement: Present  [name] 19+3 wks,breech,cx 3.6 cm,anterior placenta gr 0,normal ovaries,SVP of fluid 4.8 cm,FHR 159 bpm,EFW 326 g 76%,anatomy complete,no obvious abnormalities     No results found for this or any previous visit (from the past 24 hour(s)).  Assessment & Plan:    Pregnancy: G2P1001 at [redacted]w[redacted]d 1. Supervision of normal intrauterine pregnancy in multigravida, second  trimester  2. History of gestational hypertension & PPHTN Continue ASA  3. H/O Cardiopulmonary arrest with successful resuscitation (HCC) Rescheduled for 05/23/23  4. History of postpartum hemorrhage, currently pregnant      Meds: No orders of the defined types were placed in this encounter.  Labs/procedures today: Anatomy scan  Plan:  Continue routine obstetrical care  Next visit: prefers in person    Reviewed:  general obstetric precautions including but not limited to vaginal bleeding, contractions, leaking of fluid and fetal movement were reviewed in detail with the patient.  All questions were answered. Has home bp cuff.. Check bp weekly, let us know if >140/90.   Follow-up: Return in about 4 weeks (around 05/25/2023) for LROB.  Future Appointments  Date Time Provider Department Center  04/27/2023 11:50 AM Jacklyn Shell, CNM [facility]  05/23/2023  3:00 PM Tobb, Lavona [place], DO [facility] None    No orders of the defined types were placed in this encounter.  Jacklyn Shell DNP, CNM 04/27/2023 11:16 AM

## 2023-04-27 NOTE — Progress Notes (Signed)
[name] 19+3 wks,breech,cx 3.6 cm,anterior placenta gr 0,normal ovaries,SVP of fluid 4.8 cm,FHR 159 bpm,EFW 326 g 76%,anatomy complete,no obvious abnormalities

## 2023-04-27 NOTE — Patient Instructions (Addendum)
Laurence Slate, I greatly value your feedback.  If you receive a survey following your visit with [name] today, we appreciate you taking the time to fill it out.  Thanks, Cathie Beams, CNM     [facility] HAS MOVED!!! It is now [facility] ([address] [place], [place] [address]) Entrance located off of [address] Free 24/7 valet parking   Go to [email or web address] to register for FREE online childbirth classes    Second Trimester of Pregnancy The second trimester is from week 14 through week 27 (months 4 through 6). The second trimester is often a time when you feel your best. Your body has adjusted to being pregnant, and you begin to feel better physically. Usually, morning sickness has lessened or quit completely, you may have more energy, and you may have an increase in appetite. The second trimester is also a time when the fetus is growing rapidly. At the end of the sixth month, the fetus is about 9 inches long and weighs about 1 pounds. You will likely begin to feel the baby move (quickening) between 16 and 20 weeks of pregnancy. Body changes during your second trimester Your body continues to go through many changes during your second trimester. The changes vary from woman to woman. Your weight will continue to increase. You will notice your lower abdomen bulging out. You may begin to get stretch marks on your hips, abdomen, and breasts. You may develop headaches that can be relieved by medicines. The medicines should be approved by your health care provider. You may urinate more often because the fetus is pressing on your bladder. You may develop or continue to have heartburn as a result of your pregnancy. You may develop constipation because certain hormones are causing the muscles that push waste through your intestines to slow down. You may develop hemorrhoids or swollen, bulging veins (varicose veins). You may have back pain. This is  caused by: Weight gain. Pregnancy hormones that are relaxing the joints in your pelvis. A shift in weight and the muscles that support your balance. Your breasts will continue to grow and they will continue to become tender. Your gums may bleed and may be sensitive to brushing and flossing. Dark spots or blotches (chloasma, mask of pregnancy) may develop on your face. This will likely fade after the baby is born. A dark line from your belly button to the pubic area (linea nigra) may appear. This will likely fade after the baby is born. You may have changes in your hair. These can include thickening of your hair, rapid growth, and changes in texture. Some women also have hair loss during or after pregnancy, or hair that feels dry or thin. Your hair will most likely return to normal after your baby is born.  What to expect at prenatal visits During a routine prenatal visit: You will be weighed to make sure you and the fetus are growing normally. Your blood pressure will be taken. Your abdomen will be measured to track your baby's growth. The fetal heartbeat will be listened to. Any test results from the previous visit will be discussed.  Your health care provider may ask you: How you are feeling. If you are feeling the baby move. If you have had any abnormal symptoms, such as leaking fluid, bleeding, severe headaches, or abdominal cramping. If you are using any tobacco products, including cigarettes, chewing tobacco, and electronic cigarettes. If you have any questions.  Other tests that  may be performed during your second trimester include: Blood tests that check for: Low iron levels (anemia). High blood sugar that affects pregnant women (gestational diabetes) between 83 and 28 weeks. Rh antibodies. This is to check for a protein on red blood cells (Rh factor). Urine tests to check for infections, diabetes, or protein in the urine. An ultrasound to confirm the proper growth and  development of the baby. An amniocentesis to check for possible genetic problems. Fetal screens for spina bifida and Down syndrome. HIV (human immunodeficiency virus) testing. Routine prenatal testing includes screening for HIV, unless you choose not to have this test.  Follow these instructions at home: Medicines Follow your health care provider's instructions regarding medicine use. Specific medicines may be either safe or unsafe to take during pregnancy. Take a prenatal vitamin that contains at least 600 micrograms (mcg) of folic acid. If you develop constipation, try taking a stool softener if your health care provider approves. Eating and drinking Eat a balanced diet that includes fresh fruits and vegetables, whole grains, good sources of protein such as meat, eggs, or tofu, and low-fat dairy. Your health care provider will help you determine the amount of weight gain that is right for you. Avoid raw meat and uncooked cheese. These carry germs that can cause birth defects in the baby. If you have low calcium intake from food, talk to your health care provider about whether you should take a daily calcium supplement. Limit foods that are high in fat and processed sugars, such as fried and sweet foods. To prevent constipation: Drink enough fluid to keep your urine clear or pale yellow. Eat foods that are high in fiber, such as fresh fruits and vegetables, whole grains, and beans. Activity Exercise only as directed by your health care provider. Most women can continue their usual exercise routine during pregnancy. Try to exercise for 30 minutes at least 5 days a week. Stop exercising if you experience uterine contractions. Avoid heavy lifting, wear low heel shoes, and practice good posture. A sexual relationship may be continued unless your health care provider directs you otherwise. Relieving pain and discomfort Wear a good support bra to prevent discomfort from breast tenderness. Take  warm sitz baths to soothe any pain or discomfort caused by hemorrhoids. Use hemorrhoid cream if your health care provider approves. Rest with your legs elevated if you have leg cramps or low back pain. If you develop varicose veins, wear support hose. Elevate your feet for 15 minutes, 3-4 times a day. Limit salt in your diet. Prenatal Care Write down your questions. Take them to your prenatal visits. Keep all your prenatal visits as told by your health care provider. This is important. Safety Wear your seat belt at all times when driving. Make a list of emergency phone numbers, including numbers for family, friends, the hospital, and police and fire departments. General instructions Ask your health care provider for a referral to a local prenatal education class. Begin classes no later than the beginning of month 6 of your pregnancy. Ask for help if you have counseling or nutritional needs during pregnancy. Your health care provider can offer advice or refer you to specialists for help with various needs. Do not use hot tubs, steam rooms, or saunas. Do not douche or use tampons or scented sanitary pads. Do not cross your legs for long periods of time. Avoid cat litter boxes and soil used by cats. These carry germs that can cause birth defects in the baby and  possibly loss of the fetus by miscarriage or stillbirth. Avoid all smoking, herbs, alcohol, and unprescribed drugs. Chemicals in these products can affect the formation and growth of the baby. Do not use any products that contain nicotine or tobacco, such as cigarettes and e-cigarettes. If you need help quitting, ask your health care provider. Visit your dentist if you have not gone yet during your pregnancy. Use a soft toothbrush to brush your teeth and be gentle when you floss. Contact a health care provider if: You have dizziness. You have mild pelvic cramps, pelvic pressure, or nagging pain in the abdominal area. You have persistent  nausea, vomiting, or diarrhea. You have a bad smelling vaginal discharge. You have pain when you urinate. Get help right away if: You have a fever. You are leaking fluid from your vagina. You have spotting or bleeding from your vagina. You have severe abdominal cramping or pain. You have rapid weight gain or weight loss. You have shortness of breath with chest pain. You notice sudden or extreme swelling of your face, hands, ankles, feet, or legs. You have not felt your baby move in over an hour. You have severe headaches that do not go away when you take medicine. You have vision changes. Summary The second trimester is from week 14 through week 27 (months 4 through 6). It is also a time when the fetus is growing rapidly. Your body goes through many changes during pregnancy. The changes vary from woman to woman. Avoid all smoking, herbs, alcohol, and unprescribed drugs. These chemicals affect the formation and growth your baby. Do not use any tobacco products, such as cigarettes, chewing tobacco, and e-cigarettes. If you need help quitting, ask your health care provider. Contact your health care provider if you have any questions. Keep all prenatal visits as told by your health care provider. This is important. This information is not intended to replace advice given to you by your health care provider. Make sure you discuss any questions you have with your health care provider.   CALL TO RESCHEDULE:      [facility] at [facility] [address], [address] [place], [place]  [address] Phone:  [phone]   Fax:  [phone]

## 2023-05-02 ENCOUNTER — Other Ambulatory Visit: Payer: Self-pay | Admitting: Adult Health

## 2023-05-03 ENCOUNTER — Other Ambulatory Visit: Payer: Self-pay | Admitting: Adult Health

## 2023-05-03 MED ORDER — ONDANSETRON 4 MG PO TBDP: 4.0000 mg | ORAL_TABLET | Freq: Three times a day (TID) | ORAL | 1 refills | Status: DC | PRN

## 2023-05-03 NOTE — Progress Notes (Signed)
Rx zofran ODT

## 2023-05-23 ENCOUNTER — Ambulatory Visit: Payer: Medicaid Other | Attending: Cardiology | Admitting: Cardiology

## 2023-05-23 ENCOUNTER — Ambulatory Visit (INDEPENDENT_AMBULATORY_CARE_PROVIDER_SITE_OTHER): Payer: Medicaid Other

## 2023-05-23 VITALS — BP 110/68 | HR 117 | Ht 63.0 in | Wt 168.2 lb

## 2023-05-23 DIAGNOSIS — R42 Dizziness and giddiness: Secondary | ICD-10-CM

## 2023-05-23 DIAGNOSIS — I469 Cardiac arrest, cause unspecified: Secondary | ICD-10-CM | POA: Diagnosis not present

## 2023-05-23 DIAGNOSIS — R55 Syncope and collapse: Secondary | ICD-10-CM

## 2023-05-23 DIAGNOSIS — Z136 Encounter for screening for cardiovascular disorders: Secondary | ICD-10-CM

## 2023-05-23 NOTE — Progress Notes (Unsigned)
Cardio-Obstetrics Clinic  New Evaluation  Date:  05/23/2023   ID:  Carly Bates, DOB 11-Nov-2000, MRN [id number]  PCP:  System, Provider Not In   [facility] Providers Cardiologist:  Thomasene Ripple, DO  Electrophysiologist:  None   { Click to update primary MD,subspecialty MD or APP then REFRESH:1}    Referring MD: [place] Bing, MD   Chief Complaint: " I am ok"  History of Present Illness:    Carly Bates is a 22 y.o. female [G2P1001] who is being seen today for the evaluation of shortness of breath and post delivery cardiopulmonary arrest at the request of [place] Bing, MD.   with a history of cardiopulmonary arrest during childbirth details unclear, mild intermittent asthma, and gestational hypertension, postpartum hypertension, presents for a consultation. She reports that during the birth of her first child, she experienced a cardiac arrest. The details surrounding this event are unclear as she was in and out of consciousness at the time and the medical team primarily communicated with her sister and partner.  I have tried reviewing the chat for details but have not been able to locate a note that gives me descriptive detail of what happened.   Her postpartum hospital course was also complicated by postpartum hypertension and was on nifedipine for a while.  Currently, she is [redacted] weeks pregnant and has been referred for cardiac evaluation to prevent a similar event during this pregnancy. She reports experiencing fatigue and shortness of breath during physical exertion such as walking or standing for extended periods. She is currently taking aspirin for preeclampsia prophylaxis and monitoring her blood pressure daily.   Prior CV Studies Reviewed: The following studies were reviewed today: None today  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(HCC)     Past Surgical History:  Procedure Laterality Date   CARDIAC SURGERY     6 months  of age    { Click here to update PMH, PSH, OB Hx then refresh note  :1}   OB History     Gravida  2   Para  1   Term  1   Preterm  0   AB  0   Living  1      SAB  0   IAB  0   Ectopic  0   Multiple  0   Live Births  1           { Click here to update OB Charting then refresh note  :1}    Current Medications: Current Meds  Medication Sig   albuterol (VENTOLIN HFA) 108 (90 Base) MCG/ACT inhaler Inhale 1-2 puffs into the lungs every 6 (six) hours as needed for wheezing or shortness of breath.   aspirin EC 81 MG tablet Take 2 tablets (162 mg total) by mouth daily. Swallow whole.   ondansetron (ZOFRAN-ODT) 4 MG disintegrating tablet Take 1 tablet (4 mg total) by mouth every 8 (eight) hours as needed for nausea or vomiting.   Prenatal MV & Min w/FA-DHA (PRENATAL ADULT GUMMY/DHA/FA) 0.4-25 MG CHEW Chew by mouth.     Allergies:   Penicillins   Social History   Socioeconomic History   Marital status: Significant Other    Spouse name: Not on file   Number of children: Not on file   Years of education: Not on file   Highest education level: Not on file  Occupational History   Not on file  Tobacco Use  Smoking status: Never   Smokeless tobacco: Never  Vaping Use   Vaping status: Never Used  Substance and Sexual Activity   Alcohol use: No   Drug use: No   Sexual activity: Yes    Partners: Male    Birth control/protection: None, Condom  Other Topics Concern   Not on file  Social History Narrative   8th grade       Lives with step-mom, father    Social Determinants of Health   Financial Resource Strain: Low Risk  (03/14/2023)   Overall Financial Resource Strain (CARDIA)    Difficulty of Paying Living Expenses: Not very hard  Food Insecurity: Food Insecurity Present (03/14/2023)   Hunger Vital Sign    Worried About Running Out of Food in the Last Year: Sometimes true    Ran Out of Food in the Last Year: Never true  Transportation Needs: No  Transportation Needs (03/14/2023)   PRAPARE - Administrator, Civil Service (Medical): No    Lack of Transportation (Non-Medical): No  Physical Activity: Insufficiently Active (03/14/2023)   Exercise Vital Sign    Days of Exercise per Week: 3 days    Minutes of Exercise per Session: 30 min  Stress: No Stress Concern Present (03/14/2023)   [employer] of Occupational Health - Occupational Stress Questionnaire    Feeling of Stress : Not at all  Social Connections: Moderately Integrated (03/14/2023)   Social Connection and Isolation Panel [NHANES]    Frequency of Communication with Friends and Family: More than three times a week    Frequency of Social Gatherings with Friends and Family: Twice a week    Attends Religious Services: 1 to 4 times per year    Active Member of [employer] or Organizations: No    Attends Engineer, structural: Never    Marital Status: Living with partner  { Click here to update SDOH then refresh :1}    Family History  Problem Relation Age of Onset   Diabetes Paternal Grandmother    Diabetes Maternal Grandmother    Cancer Maternal Grandfather        prostate   Hypertension Mother    Diabetes Sister    { Click here to update FH then refresh note    :1}   ROS:   Please see the history of present illness.     All other systems reviewed and are negative.   Labs/EKG Reviewed:    EKG:   EKG was ordered today.  The ekg ordered today demonstrates sinus tachycardia.  Recent Labs: 03/14/2023: ALT 8; BUN 5; Creatinine, Ser 0.57; Hemoglobin 13.3; Platelets 442; Potassium 4.2; Sodium 138   Recent Lipid Panel No results found for: "CHOL", "TRIG", "HDL", "CHOLHDL", "LDLCALC", "LDLDIRECT"  Physical Exam:    VS:  BP 110/68 (BP Location: Left Arm, Patient Position: Sitting, Cuff Size: Normal)   Pulse (!) 117   Ht 5\' 3"  (1.6 m)   Wt 168 lb 3.2 oz (76.3 kg)   LMP 12/05/2022   SpO2 98%   BMI 29.80 kg/m     Wt Readings from Last 3  Encounters:  05/23/23 168 lb 3.2 oz (76.3 kg)  04/27/23 175 lb (79.4 kg)  04/05/23 176 lb (79.8 kg)     GEN:  Well nourished, well developed in no acute distress HEENT: Normal NECK: No JVD; No carotid bruits LYMPHATICS: No lymphadenopathy CARDIAC: RRR, no murmurs, rubs, gallops RESPIRATORY:  Clear to auscultation without rales, wheezing or rhonchi  ABDOMEN: Soft,  non-tender, non-distended MUSCULOSKELETAL:  No edema; No deformity  SKIN: Warm and dry NEUROLOGIC:  Alert and oriented x 3 PSYCHIATRIC:  Normal affect    Risk Assessment/Risk Calculators:   { Click to calculate CARPREG II - THEN refresh note :1}    { Click to caclulate Mod WHO Class of CV Risk - THEN refresh note :1}     { Click for CHADS2VASc Score - THEN Refresh Note    :[id number]}      ASSESSMENT & PLAN:    Cardiopulmonary Arrest History of cardiopulmonary arrest during previous childbirth. Unclear etiology. No current symptoms suggestive of cardiac disease. -Order echocardiogram to assess cardiac structure and function. -Initiate ambulatory cardiac monitoring to assess for baseline arrhythmias.  Pregnancy Currently [redacted] weeks pregnant. History of gestational hypertension during previous pregnancy. Currently on aspirin for preeclampsia prophylaxis. -Check blood pressure daily. -Follow-up in 8 weeks (at [redacted] weeks gestation).  Hypertension No current symptoms reported. -Continue current management plan.  She is asking for clearance for an upcoming procedure. Unfortunately, I am unable to provide any clearance given pending diagnostic testing.  Patient Instructions  Medication Instructions:  NO CHANGES   Lab Work: NONE   Testing/Procedures: Your physician has requested that you have an echocardiogram. Echocardiography is a painless test that uses sound waves to create images of your heart. It provides your doctor with information about the size and shape of your heart and how well your heart's chambers  and valves are working. This procedure takes approximately one hour. There are no restrictions for this procedure. Please do NOT wear cologne, perfume, aftershave, or lotions (deodorant is allowed). Please arrive 15 minutes prior to your appointment time.   ZIO XT- Long Term Monitor Instructions  Your physician has requested you wear a ZIO patch monitor for 14 days.  This is a single patch monitor. [employer] supplies one patch monitor per enrollment. Additional stickers are not available. Please do not apply patch if you will be having a Nuclear Stress Test,  Echocardiogram, Cardiac CT, MRI, or Chest Xray during the period you would be wearing the  monitor. The patch cannot be worn during these tests. You cannot remove and re-apply the  ZIO XT patch monitor.  Your ZIO patch monitor will be mailed 3 day USPS to your address on file. It may take 3-5 days  to receive your monitor after you have been enrolled.  Once you have received your monitor, please review the enclosed instructions. Your monitor  has already been registered assigning a specific monitor serial # to you.  Billing and Patient Assistance Program Information  We have supplied [employer] with any of your insurance information on file for billing purposes. [employer] offers a sliding scale Patient Assistance Program for patients that do not have  insurance, or whose insurance does not completely cover the cost of the ZIO monitor.  You must apply for the Patient Assistance Program to qualify for this discounted rate.  To apply, please call [employer] at [phone], select option 4, select option 2, ask to apply for  Patient Assistance Program. Meredeth Ide will ask your household income, and how many people  are in your household. They will quote your out-of-pocket cost based on that information.  [employer] will also be able to set up a 47-month, interest-free payment plan if needed.  Applying the monitor   Shave hair from upper left chest.   Hold abrader disc by orange tab. Rub abrader in 40 strokes over the upper left chest as  indicated  in your monitor instructions.  Clean area with 4 enclosed alcohol pads. Let dry.  Apply patch as indicated in monitor instructions. Patch will be placed under collarbone on left  side of chest with arrow pointing upward.  Rub patch adhesive wings for 2 minutes. Remove white label marked "1". Remove the white  label marked "2". Rub patch adhesive wings for 2 additional minutes.  While looking in a mirror, press and release button in center of patch. A small green light will  flash 3-4 times. This will be your only indicator that the monitor has been turned on.  Do not shower for the first 24 hours. You may shower after the first 24 hours.  Press the button if you feel a symptom. You will hear a small click. Record Date, Time and  Symptom in the Patient Logbook.  When you are ready to remove the patch, follow instructions on the last 2 pages of Patient  Logbook. Stick patch monitor onto the last page of Patient Logbook.  Place Patient Logbook in the blue and white box. Use locking tab on box and tape box closed  securely. The blue and white box has prepaid postage on it. Please place it in the mailbox as  soon as possible. Your physician should have your test results approximately 7 days after the  monitor has been mailed back to [facility].  Call [facility] Customer Care at [phone] if you have questions regarding  your ZIO XT patch monitor. Call them immediately if you see an orange light blinking on your  monitor.  If your monitor falls off in less than 4 days, contact our Monitor department at [phone].  If your monitor becomes loose or falls off after 4 days call [employer] at [phone] for  suggestions on securing your monitor    Follow-Up: At [facility], you and your health needs are our priority.  As part of our continuing mission to provide you with  exceptional heart care, we have created designated Provider Care Teams.  These Care Teams include your primary Cardiologist (physician) and Advanced Practice Providers (APPs -  Physician Assistants and Nurse Practitioners) who all work together to provide you with the care you need, when you need it.    Your next appointment:   8 WEEKS (OK TO OVER BOOK)  Provider:   DR. Lavona Mound Malasia Torain     Dispo:  Return in about 8 weeks (around 07/18/2023).   Medication Adjustments/Labs and Tests Ordered: Current medicines are reviewed at length with the patient today.  Concerns regarding medicines are outlined above.  Tests Ordered: Orders Placed This Encounter  Procedures   LONG TERM MONITOR (3-14 DAYS)   EKG 12-Lead   ECHOCARDIOGRAM COMPLETE   Medication Changes: No orders of the defined types were placed in this encounter.

## 2023-05-23 NOTE — Progress Notes (Unsigned)
Enrolled for [employer] to mail a ZIO XT long term holter monitor to the patients address on file.  

## 2023-05-23 NOTE — Patient Instructions (Addendum)
Medication Instructions:  NO CHANGES   Lab Work: NONE   Testing/Procedures: Your physician has requested that you have an echocardiogram. Echocardiography is a painless test that uses sound waves to create images of your heart. It provides your doctor with information about the size and shape of your heart and how well your heart's chambers and valves are working. This procedure takes approximately one hour. There are no restrictions for this procedure. Please do NOT wear cologne, perfume, aftershave, or lotions (deodorant is allowed). Please arrive 15 minutes prior to your appointment time.   ZIO XT- Long Term Monitor Instructions  Your physician has requested you wear a ZIO patch monitor for 14 days.  This is a single patch monitor. [employer] supplies one patch monitor per enrollment. Additional stickers are not available. Please do not apply patch if you will be having a Nuclear Stress Test,  Echocardiogram, Cardiac CT, MRI, or Chest Xray during the period you would be wearing the  monitor. The patch cannot be worn during these tests. You cannot remove and re-apply the  ZIO XT patch monitor.  Your ZIO patch monitor will be mailed 3 day USPS to your address on file. It may take 3-5 days  to receive your monitor after you have been enrolled.  Once you have received your monitor, please review the enclosed instructions. Your monitor  has already been registered assigning a specific monitor serial # to you.  Billing and Patient Assistance Program Information  We have supplied [employer] with any of your insurance information on file for billing purposes. [employer] offers a sliding scale Patient Assistance Program for patients that do not have  insurance, or whose insurance does not completely cover the cost of the ZIO monitor.  You must apply for the Patient Assistance Program to qualify for this discounted rate.  To apply, please call [employer] at [phone], select option 4, select option  2, ask to apply for  Patient Assistance Program. Meredeth Ide will ask your household income, and how many people  are in your household. They will quote your out-of-pocket cost based on that information.  [employer] will also be able to set up a 52-month, interest-free payment plan if needed.  Applying the monitor   Shave hair from upper left chest.  Hold abrader disc by orange tab. Rub abrader in 40 strokes over the upper left chest as  indicated in your monitor instructions.  Clean area with 4 enclosed alcohol pads. Let dry.  Apply patch as indicated in monitor instructions. Patch will be placed under collarbone on left  side of chest with arrow pointing upward.  Rub patch adhesive wings for 2 minutes. Remove white label marked "1". Remove the white  label marked "2". Rub patch adhesive wings for 2 additional minutes.  While looking in a mirror, press and release button in center of patch. A small green light will  flash 3-4 times. This will be your only indicator that the monitor has been turned on.  Do not shower for the first 24 hours. You may shower after the first 24 hours.  Press the button if you feel a symptom. You will hear a small click. Record Date, Time and  Symptom in the Patient Logbook.  When you are ready to remove the patch, follow instructions on the last 2 pages of Patient  Logbook. Stick patch monitor onto the last page of Patient Logbook.  Place Patient Logbook in the blue and white box. Use locking tab on box and tape box  closed  securely. The blue and white box has prepaid postage on it. Please place it in the mailbox as  soon as possible. Your physician should have your test results approximately 7 days after the  monitor has been mailed back to [facility].  Call [facility] Customer Care at [phone] if you have questions regarding  your ZIO XT patch monitor. Call them immediately if you see an orange light blinking on your  monitor.  If your monitor falls  off in less than 4 days, contact our Monitor department at [phone].  If your monitor becomes loose or falls off after 4 days call [employer] at [phone] for  suggestions on securing your monitor    Follow-Up: At [facility], you and your health needs are our priority.  As part of our continuing mission to provide you with exceptional heart care, we have created designated Provider Care Teams.  These Care Teams include your primary Cardiologist (physician) and Advanced Practice Providers (APPs -  Physician Assistants and Nurse Practitioners) who all work together to provide you with the care you need, when you need it.    Your next appointment:   8 WEEKS (OK TO OVER BOOK)  Provider:   DR. Lavona Mound TOBB

## 2023-05-25 ENCOUNTER — Telehealth: Payer: Self-pay | Admitting: *Deleted

## 2023-05-25 ENCOUNTER — Encounter: Payer: Medicaid Other | Admitting: Advanced Practice Midwife

## 2023-05-25 NOTE — Telephone Encounter (Signed)
   Primary Cardiologist: Thomasene Ripple, DO  Chart reviewed as part of pre-operative protocol coverage. Simple dental extractions (1-2 teeth) are considered low risk procedures per guidelines and generally do not require any specific cardiac clearance. It is also generally accepted that for simple extractions and dental cleanings, there is no need to interrupt blood thinner therapy.   SBE prophylaxis is not required for the patient.  I will route this recommendation to the requesting party via Epic fax function and remove from pre-op pool.  Please call with questions.  Levi Aland, NP-C  05/25/2023, 2:13 PM [address] Office [phone] Fax [phone]

## 2023-05-25 NOTE — Telephone Encounter (Signed)
   Pre-operative Risk Assessment    Patient Name: Carly Bates  DOB: May 18, 2001 MRN: [id number]  DATE OF LAST VISIT: 05/23/23 DR. TOBB DATE OF NEXT VISIT: 07/20/23 DR. TOBB  Dental Requests If request is for dental extraction, please clarify the number of teeth to be extracted and place under No 1 below.   If the patient is currently at the dentist's office, send SecureChat message to Pre-Op Callback Staff (CMA/nurse) to input urgent request.   If the patient is not currently in the dentist's office, please route to the Pre-Op pool.              :1}  Request for Surgical Clearance    Procedure:  Dental Extraction - Amount of Teeth to be Pulled:  1 TOOTH FOR SIMPLE EXTRACTION PER TINA WITH THE  DENTAL OFFICE   Date of Surgery:  Clearance 08/15/23                               Surgeon:  DR. Guadelupe Sabin, DDS Surgeon's Group or Practice Name:  [facility] Phone number:  [phone] Fax number:  [phone]   Type of Clearance Requested:   - Medical ; ASA    Type of Anesthesia:  Local    Additional requests/questions:    Elpidio Anis   05/25/2023, 1:56 PM

## 2023-05-26 DIAGNOSIS — R42 Dizziness and giddiness: Secondary | ICD-10-CM | POA: Diagnosis not present

## 2023-05-26 DIAGNOSIS — R55 Syncope and collapse: Secondary | ICD-10-CM | POA: Diagnosis not present

## 2023-05-28 ENCOUNTER — Encounter: Payer: Self-pay | Admitting: Cardiology

## 2023-05-29 ENCOUNTER — Encounter: Payer: Self-pay | Admitting: Advanced Practice Midwife

## 2023-05-29 ENCOUNTER — Ambulatory Visit (INDEPENDENT_AMBULATORY_CARE_PROVIDER_SITE_OTHER): Payer: Medicaid Other | Admitting: Advanced Practice Midwife

## 2023-05-29 VITALS — BP 122/72 | HR 100 | Wt 170.0 lb

## 2023-05-29 DIAGNOSIS — Z3A24 24 weeks gestation of pregnancy: Secondary | ICD-10-CM

## 2023-05-29 DIAGNOSIS — O26842 Uterine size-date discrepancy, second trimester: Secondary | ICD-10-CM

## 2023-05-29 DIAGNOSIS — O26843 Uterine size-date discrepancy, third trimester: Secondary | ICD-10-CM

## 2023-05-29 DIAGNOSIS — O2612 Low weight gain in pregnancy, second trimester: Secondary | ICD-10-CM | POA: Insufficient documentation

## 2023-05-29 DIAGNOSIS — Z3480 Encounter for supervision of other normal pregnancy, unspecified trimester: Secondary | ICD-10-CM

## 2023-05-29 MED ORDER — ONDANSETRON 4 MG PO TBDP: 4.0000 mg | ORAL_TABLET | Freq: Four times a day (QID) | ORAL | 2 refills | Status: DC | PRN

## 2023-05-29 MED ORDER — DOXYLAMINE-PYRIDOXINE 10-10 MG PO TBEC: DELAYED_RELEASE_TABLET | ORAL | 6 refills | Status: DC

## 2023-05-29 NOTE — Patient Instructions (Signed)
1. Before your test, do not eat or drink anything for 8-10 hours prior to your  ?appointment (a small amount of water is allowed and you may take any medicines you normally take). Be sure to drink lots of water the day before. ?2. When you arrive, your blood will be drawn for a ?fasting? blood sugar level.  ?Then you will be given a sweetened carbonated beverage to drink. You should  ?complete drinking this beverage within five minutes. After finishing the  ?beverage, you will have your blood drawn exactly 1 and 2 hours later. Having  ?your blood drawn on time is an important part of this test. A total of three blood  ?samples will be done. ?3. The test takes approximately 2 ? hours. During the test, do not have anything to  ?eat or drink. Do not smoke, chew gum (not even sugarless gum) or use breath mints.  ?4. During the test you should remain close by and seated as much as possible and  ?avoid walking around. You may want to bring a book or something else to  ?occupy your time.  ?5. After your test, you may eat and drink as normal. You may want to bring a snack  ?to eat after the test is finished. Your provider will advise you as to the results of  ?this test and any follow-up if necessary ? ?If your sugar test is positive for gestational diabetes, you will be given an phone call and further instructions discussed. If you wish to know all of your test results before your next appointment, feel free to call the office, or look up your test results on Mychart.  (The range that the lab uses for normal values of the sugar test are not necessarily the range that is used for pregnant women; if your results are within the normal range, they are definitely normal.  However, if a value is deemed "high" by the lab, it may not be too high for a pregnant woman.  We will need to discuss the results if your value(s) fall in the "high" category).   ? ? ?Tdap Vaccine ?It is recommended that you get the Tdap vaccine during the  third trimester of EACH pregnancy to help protect your baby from getting pertussis (whooping cough) ?27-36 weeks is the BEST time to do this so that you can pass the protection on to your baby. During pregnancy is better than after pregnancy, but if you are unable to get it during pregnancy it will be offered at the hospital. ?You will be offered this vaccine in the office after 27 weeks.  If you do not have health insurance, you can get the vaccine from the [facility] (no appointment needed).  ?Everyone who will be around your baby should also be up-to-date on their vaccines. Adults (who are not pregnant) only need 1 dose of Tdap during adulthood. ? ? ? ? ?

## 2023-05-29 NOTE — Progress Notes (Signed)
LOW-RISK PREGNANCY VISIT Patient name: Carly Bates MRN [id number]  Date of birth: [birth year] Chief Complaint:   Routine Prenatal Visit  History of Present Illness:   Carly Bates is a 22 y.o. G57P1001 female at [redacted]w[redacted]d with an Estimated Date of Delivery: 09/18/23 being seen today for ongoing management of a low-risk pregnancy.  Today she reports no complaints. Continued nausea and vomiting. Eats "a lot" but also vomits several times a day. Never tried branded diclegis.  States brand zofran ODT works for her, whereas generic does not.  Had cardiology consult:  there was some confusion about when pt had cardiac arrest; Cards was under the impression that it was after childbirth and pt thought it was as well.  Record review reveals that she had a mild PPH after childbirth, and that experience is what she remembers as being "kind of out of it, don't really remember what happened"  and assumed that it was a cardiac arrest. Records from her birth were located and summary passed on to cardiology. Pt currently wearing a Holter, and has planned f/u w/cardiology. 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 Contractions: Not present.  .  Movement: Present. denies leaking of fluid. Review of Systems:   Pertinent items are noted in HPI Denies abnormal vaginal discharge w/ itching/odor/irritation, headaches, visual changes, shortness of breath, chest pain, abdominal pain, severe nausea/vomiting, or problems with urination or bowel movements unless otherwise stated above. Pertinent History Reviewed:  Reviewed past medical,surgical, social, obstetrical and family history.  Reviewed problem list, medications and allergies. Physical Assessment:   Vitals:   05/29/23 1451  BP: 122/72  Pulse: 100  Weight: 170 lb (77.1 kg)  Body mass index is 30.11 kg/m.        Physical Examination:   General appearance: Well appearing, and in no  distress  Mental status: Alert, oriented to person, place, and time  Skin: Warm & dry  Cardiovascular: Normal heart rate noted  Respiratory: Normal respiratory effort, no distress  Abdomen: Soft, gravid, nontender  Pelvic: Cervical exam deferred         Extremities: Edema: None Chaperone:  N/A   Fetal Status: Fetal Heart Rate (bpm): 148 Fundal Height: 21 cm Movement: Present      No results found for this or any previous visit (from the past 24 hour(s)).  Assessment & Plan:    Pregnancy: G2P1001 at [redacted]w[redacted]d 1. Supervision of other normal pregnancy, antepartum   2. [redacted] weeks gestation of pregnancy      Meds:  Meds ordered this encounter  Medications   Doxylamine-Pyridoxine (DICLEGIS) 10-10 MG TBEC    Sig: Take 2 qhs; may also take one in am and one in afternoon prn nausea    Dispense:  120 tablet    Refill:  6    Order Specific Question:   Supervising Provider    Answer:   Duane Lope H [[id number]]   ondansetron (ZOFRAN-ODT) 4 MG disintegrating tablet    Sig: Take 1 tablet (4 mg total) by mouth every 6 (six) hours as needed for nausea or vomiting.    Dispense:  40 tablet    Refill:  2    Pt prefers name brand--I can't find it in EPIC, please substitute name brand (ODT)    Order Specific Question:   Supervising Provider    Answer:   Lazaro Arms [[id number]]   Labs/procedures today: none  Plan:  Continue routine obstetrical care  Next visit: prefers will be in person for PN2     Reviewed: Preterm labor symptoms and general obstetric precautions including but not limited to vaginal bleeding, contractions, leaking of fluid and fetal movement were reviewed in detail with the patient.  All questions were answered. Has home bp cuff.. Check bp weekly, let us know if >140/90.   Follow-up: Return in about 4 weeks (around 06/26/2023) for PN2/LROB/US for EFW.  Future Appointments  Date Time Provider Department Center  06/20/2023  2:50 PM [facility]CHO 2 [facility]   06/26/2023  9:00 AM [facility]  06/26/2023 10:30 AM [facility] - [facility] IMG 2 [facility]IMG None  06/26/2023 11:30 AM Jacklyn Shell, CNM [facility]  07/20/2023  3:00 PM Tobb, Lavona Mound, DO [facility] None    Orders Placed This Encounter  Procedures   US OB Follow Up   Jacklyn Shell DNP, CNM 05/29/2023 6:41 PM

## 2023-06-01 ENCOUNTER — Telehealth: Payer: Self-pay | Admitting: Cardiology

## 2023-06-01 NOTE — Telephone Encounter (Signed)
Patient states she is calling in about getting another heart monitor, Dr. Servando Salina wanted her to have. Please advise

## 2023-06-01 NOTE — Telephone Encounter (Signed)
Called patient and discussed what happened with her Zio. She states it feel off after less than 24 hours.  Instructed her to cal the phone number for the Zio representative listed on the paperwork that came with the first Zio. Instructed her to call them and tell them what happened and request another monitor be mailed to her.  Also instructed not to shower for the first 24 hours as instructed to avoid another monitor coming off.

## 2023-06-15 ENCOUNTER — Telehealth: Payer: Self-pay | Admitting: *Deleted

## 2023-06-15 NOTE — Telephone Encounter (Signed)
Pt called and stated that she is having low back pain and can't get out of the bed. I asked patient if she had tried tylenol or any home remedies. She stated that she had not. I advised tylenol extra strength and heating pad to back. Pt states that when she gets up she feels dizzy, states that she isnt really eating or drinking. I recommended hydration and eating small meals. If things worsen or isn't able to get fluids in she should go to [facility] or call us back. No other questions at this time.

## 2023-06-20 ENCOUNTER — Other Ambulatory Visit (HOSPITAL_COMMUNITY): Payer: Medicaid Other

## 2023-06-20 ENCOUNTER — Encounter (HOSPITAL_COMMUNITY): Payer: Self-pay

## 2023-06-26 ENCOUNTER — Other Ambulatory Visit: Payer: Self-pay | Admitting: Advanced Practice Midwife

## 2023-06-26 ENCOUNTER — Other Ambulatory Visit: Payer: Medicaid Other

## 2023-06-26 ENCOUNTER — Ambulatory Visit: Payer: Medicaid Other | Admitting: Radiology

## 2023-06-26 ENCOUNTER — Encounter: Payer: Self-pay | Admitting: Advanced Practice Midwife

## 2023-06-26 ENCOUNTER — Ambulatory Visit: Payer: Medicaid Other | Admitting: Advanced Practice Midwife

## 2023-06-26 VITALS — BP 122/80 | HR 110 | Wt 169.5 lb

## 2023-06-26 DIAGNOSIS — Z3A28 28 weeks gestation of pregnancy: Secondary | ICD-10-CM

## 2023-06-26 DIAGNOSIS — O2612 Low weight gain in pregnancy, second trimester: Secondary | ICD-10-CM

## 2023-06-26 DIAGNOSIS — O26843 Uterine size-date discrepancy, third trimester: Secondary | ICD-10-CM

## 2023-06-26 DIAGNOSIS — O4103X0 Oligohydramnios, third trimester, not applicable or unspecified: Secondary | ICD-10-CM

## 2023-06-26 DIAGNOSIS — O288 Other abnormal findings on antenatal screening of mother: Secondary | ICD-10-CM

## 2023-06-26 DIAGNOSIS — Z3A27 27 weeks gestation of pregnancy: Secondary | ICD-10-CM

## 2023-06-26 DIAGNOSIS — Z131 Encounter for screening for diabetes mellitus: Secondary | ICD-10-CM | POA: Diagnosis not present

## 2023-06-26 DIAGNOSIS — O2613 Low weight gain in pregnancy, third trimester: Secondary | ICD-10-CM

## 2023-06-26 DIAGNOSIS — Z3480 Encounter for supervision of other normal pregnancy, unspecified trimester: Secondary | ICD-10-CM

## 2023-06-26 DIAGNOSIS — Z1332 Encounter for screening for maternal depression: Secondary | ICD-10-CM | POA: Diagnosis not present

## 2023-06-26 DIAGNOSIS — O4100X0 Oligohydramnios, unspecified trimester, not applicable or unspecified: Secondary | ICD-10-CM | POA: Insufficient documentation

## 2023-06-26 NOTE — Progress Notes (Signed)
GA 28 Single active female fetus,  cephalic   FHR 147 bpm Anterior placenta high,  gr 2        AFI 7.5 cm  1.3%    SVP = 3.4 cm borderline low amn fluid Good movements and respiratons seen  (BPP = 8/8) EFW 68%  1287g    CL = 4.1 cm   closed

## 2023-06-26 NOTE — Progress Notes (Signed)
   LOW-RISK PREGNANCY VISIT Patient name: Carly Bates MRN [id number]  Date of birth: 2000/12/06 Chief Complaint:   Routine Prenatal Visit (PN2 & [name] today!!)  History of Present Illness:   Carly Bates is a 22 y.o. G41P1001 female at [redacted]w[redacted]d with an Estimated Date of Delivery: 09/18/23 being seen today for ongoing management of a low-risk pregnancy.  Today she reports no complaints.  Still vomits, but mainly on days where she forgets to take her diclegis the night before. Contractions: Not present. Vag. Bleeding: None.  Movement: Present. denies leaking of fluid. Review of Systems:   Pertinent items are noted in HPI Denies abnormal vaginal discharge w/ itching/odor/irritation, headaches, visual changes, shortness of breath, chest pain, abdominal pain, severe nausea/vomiting, or problems with urination or bowel movements unless otherwise stated above. Pertinent History Reviewed:  Reviewed past medical,surgical, social, obstetrical and family history.  Reviewed problem list, medications and allergies. Physical Assessment:   Vitals:   06/26/23 1138  BP: 122/80  Pulse: (!) 110  Weight: 169 lb 8 oz (76.9 kg)  Body mass index is 30.03 kg/m.        Physical Examination:   General appearance: Well appearing, and in no distress  Mental status: Alert, oriented to person, place, and time  Skin: Warm & dry  Cardiovascular: Normal heart rate noted  Respiratory: Normal respiratory effort, no distress  Abdomen: Soft, gravid, nontender  Pelvic: Cervical exam deferred         Extremities: Edema: None Chaperone:  N/A   Fetal Status: Fetal Heart Rate (bpm): 24 Fundal Height: 23 cm Movement: Present    GA 28 Single active female fetus,  cephalic   FHR 147 bpm Anterior placenta high,  gr 2        AFI 7.5 cm  1.3%    SVP = 3.4 cm borderline low amn fluid Good movements and respiratons seen  (BPP = 8/8) EFW 68%  1287g    CL = 4.1 cm   closed     Discussed w/Dr. Charlotta Newton.  Per recommendation, no FU  needed, but watch fundal heights closely and repeat PRN   No results found for this or any previous visit (from the past 24 hour(s)).  Assessment & Plan:    Pregnancy: G2P1001 at [redacted]w[redacted]d 1. [redacted] weeks gestation of pregnancy   2. Supervision of other normal pregnancy, antepartum   3. Low weight gain during pregnancy in second trimester EFW 68%.  Set an alarm to remember to take Diclegis     Meds: No orders of the defined types were placed in this encounter.  Labs/procedures today: [name]  Plan:  Continue routine obstetrical care  Next visit: prefers in person    Reviewed: Preterm labor symptoms and general obstetric precautions including but not limited to vaginal bleeding, contractions, leaking of fluid and fetal movement were reviewed in detail with the patient.  All questions were answered. Has home bp cuff. Check bp weekly, let us know if >140/90.   Follow-up: No follow-ups on file.  Future Appointments  Date Time Provider Department Center  07/10/2023  2:50 PM Cheral Marker, [place] [facility]  07/19/2023  9:05 AM [facility] 6 [facility]  07/20/2023  3:00 PM Tobb, Kardie, DO [facility] None    No orders of the defined types were placed in this encounter.  Jacklyn Shell DNP, CNM 06/26/2023 1:44 PM

## 2023-06-27 LAB — CBC
Hematocrit: 37.6 % (ref 34.0–46.6)
Hemoglobin: 11.8 g/dL (ref 11.1–15.9)
MCH: 29 pg (ref 26.6–33.0)
MCHC: 31.4 g/dL — ABNORMAL LOW (ref 31.5–35.7)
MCV: 92 fL (ref 79–97)
Platelets: 419 10*3/uL (ref 150–450)
RBC: 4.07 x10E6/uL (ref 3.77–5.28)
RDW: 13.1 % (ref 11.7–15.4)
WBC: 9.1 10*3/uL (ref 3.4–10.8)

## 2023-06-27 LAB — GLUCOSE TOLERANCE, 2 HOURS W/ 1HR
Glucose, 1 hour: 145 mg/dL (ref 70–179)
Glucose, 2 hour: 146 mg/dL (ref 70–152)
Glucose, Fasting: 84 mg/dL (ref 70–91)

## 2023-06-27 LAB — ANTIBODY SCREEN: Antibody Screen: NEGATIVE

## 2023-06-27 LAB — RPR: RPR Ser Ql: NONREACTIVE

## 2023-06-27 LAB — HIV ANTIBODY (ROUTINE TESTING W REFLEX): HIV Screen 4th Generation wRfx: NONREACTIVE

## 2023-07-10 ENCOUNTER — Ambulatory Visit (INDEPENDENT_AMBULATORY_CARE_PROVIDER_SITE_OTHER): Payer: Medicaid Other | Admitting: Women's Health

## 2023-07-10 ENCOUNTER — Encounter: Payer: Self-pay | Admitting: Women's Health

## 2023-07-10 VITALS — BP 119/76 | HR 98 | Wt 163.0 lb

## 2023-07-10 DIAGNOSIS — Z3A30 30 weeks gestation of pregnancy: Secondary | ICD-10-CM

## 2023-07-10 DIAGNOSIS — Z3483 Encounter for supervision of other normal pregnancy, third trimester: Secondary | ICD-10-CM

## 2023-07-10 DIAGNOSIS — Z3480 Encounter for supervision of other normal pregnancy, unspecified trimester: Secondary | ICD-10-CM

## 2023-07-10 MED ORDER — PANTOPRAZOLE SODIUM 20 MG PO TBEC: 20.0000 mg | DELAYED_RELEASE_TABLET | Freq: Every day | ORAL | 3 refills | Status: DC

## 2023-07-10 NOTE — Patient Instructions (Signed)
Carly Bates, thank you for choosing our office today! We appreciate the opportunity to meet your healthcare needs. You may receive a short survey by mail, e-mail, or through [employer]. If you are happy with your care we would appreciate if you could take just a few minutes to complete the survey questions. We read all of your comments and take your feedback very seriously. Thank you again for choosing our office.  Center for [employer] Team at [facility]  [facility] at [facility] ([address] [place], [place] [address]) Entrance C, located off of [address] Free 24/7 valet parking   CLASSES: Go to [email or web address] to register for classes (childbirth, breastfeeding, waterbirth, infant CPR, daddy bootcamp, etc.)  Call the office [phone]) or go to [facility] if: You begin to have strong, frequent contractions Your water breaks.  Sometimes it is a big gush of fluid, sometimes it is just a trickle that keeps getting your panties wet or running down your legs You have vaginal bleeding.  It is normal to have a small amount of spotting if your cervix was checked.  You don't feel your baby moving like normal.  If you don't, get you something to eat and drink and lay down and focus on feeling your baby move.   If your baby is still not moving like normal, you should call the office or go to [facility].  Call the office [phone]) or go to [facility] hospital for these signs of pre-eclampsia: Severe headache that does not go away with Tylenol Visual changes- seeing spots, double, blurred vision Pain under your right breast or upper abdomen that does not go away with Tums or heartburn medicine Nausea and/or vomiting Severe swelling in your hands, feet, and face   Tdap Vaccine It is recommended that you get the Tdap vaccine during the third trimester of EACH pregnancy to help protect your baby from getting pertussis (whooping cough) 27-36 weeks is the BEST time to do  this so that you can pass the protection on to your baby. During pregnancy is better than after pregnancy, but if you are unable to get it during pregnancy it will be offered at the hospital.  You can get this vaccine with [name], at the health department, your family doctor, or some local pharmacies Everyone who will be around your baby should also be up-to-date on their vaccines before the baby comes. Adults (who are not pregnant) only need 1 dose of Tdap during adulthood.   [facility] Pediatricians/Family Doctors [facility]): [address] [name], [phone]           [facility]: [address], [phone]                [facility] Medicine [facility]): [address], [phone] (call to ask if accepting patients) [facility] Department: [address], [place], [phone]    [facility] Pediatricians/Family Doctors [facility]): [phone] [address], [phone] [facility]: [address] [place], [phone] [facility]: [address], [phone]  [facility] Doctors  [facility]): [phone] [facility]: [address] [phone]   [facility] Doctors [facility]: [address] [phone]  [facility] Family Doctors  [facility]: [phone], [phone]  Home Blood Pressure Monitoring for Patients   Your provider has recommended that you check your  blood pressure (BP) at least once a week at home. If you do not have a blood pressure cuff at home, one will be provided for you. Contact your provider if you have not received your monitor within 1 week.   Helpful Tips for Accurate Home Blood Pressure Checks  Don't smoke, exercise, or drink caffeine 30 minutes before checking your BP Use the restroom before checking your BP (a full bladder can raise your  pressure) Relax in a comfortable upright chair Feet on the ground Left arm resting comfortably on a flat surface at the level of your heart Legs uncrossed Back supported Sit quietly and don't talk Place the cuff on your bare arm Adjust snuggly, so that only two fingertips can fit between your skin and the top of the cuff Check 2 readings separated by at least one minute Keep a log of your BP readings For a visual, please reference this diagram: http://ccnc.care/bpdiagram  Provider Name: [facility]     Phone: [phone]  Zone 1: ALL CLEAR  Continue to monitor your symptoms:  BP reading is less than 140 (top number) or less than 90 (bottom number)  No right upper stomach pain No headaches or seeing spots No feeling nauseated or throwing up No swelling in face and hands  Zone 2: CAUTION Call your doctor's office for any of the following:  BP reading is greater than 140 (top number) or greater than 90 (bottom number)  Stomach pain under your ribs in the middle or right side Headaches or seeing spots Feeling nauseated or throwing up Swelling in face and hands  Zone 3: EMERGENCY  Seek immediate medical care if you have any of the following:  BP reading is greater than160 (top number) or greater than 110 (bottom number) Severe headaches not improving with Tylenol Serious difficulty catching your breath Any worsening symptoms from Zone 2  Preterm Labor and Birth Information  The normal length of a pregnancy is 39-41 weeks. Preterm labor is when labor starts before 37 completed weeks of pregnancy. What are the risk factors for preterm labor? Preterm labor is more likely to occur in women who: Have certain infections during pregnancy such as a bladder infection, sexually transmitted infection, or infection inside the uterus (chorioamnionitis). Have a shorter-than-normal cervix. Have gone into preterm labor before. Have had surgery on their cervix. Are younger than age 13  or older than age 21. Are African American. Are pregnant with twins or multiple babies (multiple gestation). Take street drugs or smoke while pregnant. Do not gain enough weight while pregnant. Became pregnant shortly after having been pregnant. What are the symptoms of preterm labor? Symptoms of preterm labor include: Cramps similar to those that can happen during a menstrual period. The cramps may happen with diarrhea. Pain in the abdomen or lower back. Regular uterine contractions that may feel like tightening of the abdomen. A feeling of increased pressure in the pelvis. Increased watery or bloody mucus discharge from the vagina. Water breaking (ruptured amniotic sac). Why is it important to recognize signs of preterm labor? It is important to recognize signs of preterm labor because babies who are born prematurely may not be fully developed. This can put them at an increased risk for: Long-term (chronic) heart and lung problems. Difficulty immediately after birth with regulating body systems, including blood sugar, body temperature, heart rate, and breathing rate. Bleeding in the brain. Cerebral palsy. Learning difficulties. Death. These risks are highest for babies who are born before 34 weeks  of pregnancy. How is preterm labor treated? Treatment depends on the length of your pregnancy, your condition, and the health of your baby. It may involve: Having a stitch (suture) placed in your cervix to prevent your cervix from opening too early (cerclage). Taking or being given medicines, such as: Hormone medicines. These may be given early in pregnancy to help support the pregnancy. Medicine to stop contractions. Medicines to help mature the baby's lungs. These may be prescribed if the risk of delivery is high. Medicines to prevent your baby from developing cerebral palsy. If the labor happens before 34 weeks of pregnancy, you may need to stay in the hospital. What should I do if I  think I am in preterm labor? If you think that you are going into preterm labor, call your health care provider right away. How can I prevent preterm labor in future pregnancies? To increase your chance of having a full-term pregnancy: Do not use any tobacco products, such as cigarettes, chewing tobacco, and e-cigarettes. If you need help quitting, ask your health care provider. Do not use street drugs or medicines that have not been prescribed to you during your pregnancy. Talk with your health care provider before taking any herbal supplements, even if you have been taking them regularly. Make sure you gain a healthy amount of weight during your pregnancy. Watch for infection. If you think that you might have an infection, get it checked right away. Make sure to tell your health care provider if you have gone into preterm labor before. This information is not intended to replace advice given to you by your health care provider. Make sure you discuss any questions you have with your health care provider. Document Revised: 11/09/2018 Document Reviewed: 12/09/2015 Elsevier Patient Education  2020 [employer].

## 2023-07-10 NOTE — Progress Notes (Addendum)
LOW-RISK PREGNANCY VISIT Patient name: Carly Bates MRN [id number]  Date of birth: 09/17/2000 Chief Complaint:   Routine Prenatal Visit (Tdap today/ not sure about flu shot)  History of Present Illness:   Carly Bates is a 22 y.o. G55P1001 female at [redacted]w[redacted]d with an Estimated Date of Delivery: 09/18/23 being seen today for ongoing management of a low-risk pregnancy.   Today she reports  still not eating well, takes diclegis before bed, but throws up. Issue getting zofran from pharmacy, helps some, plans to call again today. States TUMS helps the best, but expensive buying them . Tries Ensure, but makes her thirsty so drinks a lot of water and throws everything up.  Contractions: Not present.  .  Movement: Present. denies leaking of fluid.     06/26/2023   11:40 AM 03/14/2023    9:23 AM 01/18/2023    2:20 PM 09/27/2021   10:07 AM 06/14/2021   10:01 AM  Depression screen PHQ 2/9  Decreased Interest 2 0 0 2 2  Down, Depressed, Hopeless 1 0 0 1 0  PHQ - 2 Score 3 0 0 3 2  Altered sleeping 1 0 0 1 1  Tired, decreased energy 1 0 0 2 1  Change in appetite 3 3 2 3 1   Feeling bad or failure about yourself  0 0 0 1 0  Trouble concentrating 0 1 0 2 0  Moving slowly or fidgety/restless 0 0 0 0 0  Suicidal thoughts 0 0 0 0 0  PHQ-9 Score 8 4 2 12 5         06/26/2023   11:42 AM 03/14/2023    9:23 AM 01/18/2023    2:21 PM 09/27/2021   10:07 AM  GAD 7 : Generalized Anxiety Score  Nervous, Anxious, on Edge 0 0 0 1  Control/stop worrying 1 0 0 1  Worry too much - different things 1 0 0 1  Trouble relaxing 0 0 0 1  Restless 0 0 0 0  Easily annoyed or irritable 0 0 0 1  Afraid - awful might happen 0 0 0 0  Total GAD 7 Score 2 0 0 5      Review of Systems:   Pertinent items are noted in HPI Denies abnormal vaginal discharge w/ itching/odor/irritation, headaches, visual changes, shortness of breath, chest pain, abdominal pain, severe nausea/vomiting, or problems with urination or bowel  movements unless otherwise stated above. Pertinent History Reviewed:  Reviewed past medical,surgical, social, obstetrical and family history.  Reviewed problem list, medications and allergies. Physical Assessment:   Vitals:   07/10/23 1440  BP: 119/76  Pulse: 98  Weight: 163 lb (73.9 kg)  Body mass index is 28.87 kg/m.        Physical Examination:   General appearance: Well appearing, and in no distress  Mental status: Alert, oriented to person, place, and time  Skin: Warm & dry  Cardiovascular: Normal heart rate noted  Respiratory: Normal respiratory effort, no distress  Abdomen: Soft, gravid, nontender  Pelvic: Cervical exam deferred         Extremities: Edema: None  Fetal Status: Fetal Heart Rate (bpm): 148 Fundal Height: 28 cm Movement: Present    Chaperone: N/A   No results found for this or any previous visit (from the past 24 hour(s)).  Assessment & Plan:  1) Low-risk pregnancy G2P1001 at [redacted]w[redacted]d with an Estimated Date of Delivery: 09/18/23   2) Weight loss d/t not eating well, n/v, has diclegis, zofran (calling  today for refill), says TUMS helps most so may be more reflux related. Rx protonix. Try milkshakes, high calorie foods. EFW 68% last visit.   3) Borderline AFI> last visit, AFI 7.5cm   Meds:  Meds ordered this encounter  Medications   pantoprazole (PROTONIX) 20 MG tablet    Sig: Take 1 tablet (20 mg total) by mouth daily.    Dispense:  3 tablet    Refill:  3   Labs/procedures today: none  Plan:  Continue routine obstetrical care  Next visit: prefers in person    Reviewed: Preterm labor symptoms and general obstetric precautions including but not limited to vaginal bleeding, contractions, leaking of fluid and fetal movement were reviewed in detail with the patient.  All questions were answered. Does have home bp cuff. Office bp cuff given: not applicable. Check bp weekly, let us know if consistently >140 and/or >90.  Follow-up: Return in about 2 weeks  (around 07/24/2023) for LROB, CNM, in person.  Future Appointments  Date Time Provider Department Center  07/19/2023  9:05 AM [facility] 6 [facility]  07/20/2023  3:00 PM Tobb, Kardie, DO [facility] None  07/24/2023  2:10 PM Cresenzo-Dishmon, Scarlette Calico, CNM [facility]    No orders of the defined types were placed in this encounter.  Cheral Marker CNM, [facility] 07/10/2023 3:12 PM

## 2023-07-19 ENCOUNTER — Ambulatory Visit (HOSPITAL_COMMUNITY): Payer: Medicaid Other | Attending: Cardiology

## 2023-07-19 DIAGNOSIS — R55 Syncope and collapse: Secondary | ICD-10-CM | POA: Diagnosis not present

## 2023-07-19 DIAGNOSIS — R42 Dizziness and giddiness: Secondary | ICD-10-CM | POA: Diagnosis not present

## 2023-07-19 LAB — ECHOCARDIOGRAM COMPLETE
Calc EF: 46.4 %
S' Lateral: 3.65 cm
Single Plane A2C EF: 49.7 %
Single Plane A4C EF: 46.9 %

## 2023-07-20 ENCOUNTER — Ambulatory Visit: Payer: Medicaid Other | Admitting: Cardiology

## 2023-07-20 ENCOUNTER — Telehealth: Payer: Self-pay | Admitting: Cardiology

## 2023-07-20 NOTE — Telephone Encounter (Signed)
Pt has appt today at 3 and her boyfriend is bringing her and they have called him in to go to work. Pt is pregnant and states she really needs this appt. I told her I would send a note and she if there was any way possible she could come in early. Please advise

## 2023-07-20 NOTE — Telephone Encounter (Signed)
Called and spoke to patient. Patient calling to cancel her appointment that was scheduled for 3 pm today because she did not have a ride. She then asked if it can be changed to a video visit. After talking to Dr Servando Salina patient was told she could not do a video visit but could be seen tomorrow. Offer patient an appt for tomorrow and she stated" I have to come early" I offered her a 9 am and she stated" I have trouble getting up in the morning, that's when I hurt the most, I'll have to pee before I get there and then I can't find a spot close and have to walk a long way" Patient was advised to call back to reschedule when it was convenient for her. Patient will call back to schedule an appointment.

## 2023-07-20 NOTE — Telephone Encounter (Signed)
Appointment cancelled

## 2023-07-24 ENCOUNTER — Ambulatory Visit: Payer: Medicaid Other | Admitting: Advanced Practice Midwife

## 2023-07-24 ENCOUNTER — Encounter: Payer: Self-pay | Admitting: Advanced Practice Midwife

## 2023-07-24 VITALS — BP 113/76 | HR 99 | Wt 169.0 lb

## 2023-07-24 DIAGNOSIS — Z3A32 32 weeks gestation of pregnancy: Secondary | ICD-10-CM

## 2023-07-24 DIAGNOSIS — Z3480 Encounter for supervision of other normal pregnancy, unspecified trimester: Secondary | ICD-10-CM

## 2023-07-24 DIAGNOSIS — Z3483 Encounter for supervision of other normal pregnancy, third trimester: Secondary | ICD-10-CM

## 2023-07-24 DIAGNOSIS — I469 Cardiac arrest, cause unspecified: Secondary | ICD-10-CM

## 2023-07-24 DIAGNOSIS — Z23 Encounter for immunization: Secondary | ICD-10-CM

## 2023-07-24 NOTE — Progress Notes (Addendum)
   LOW-RISK PREGNANCY VISIT Patient name: Carly Bates MRN [id number]  Date of birth: 01/07/2001 Chief Complaint:   Routine Prenatal Visit (Tdap today)  History of Present Illness:   Carly Bates is a 22 y.o. G31P1001 female at [redacted]w[redacted]d with an Estimated Date of Delivery: 09/18/23 being seen today for ongoing management of a low-risk pregnancy.  Today she reports hasn't thrown up in 5 days. Cardiology appointment was normal  Contractions: Not present.  .  Movement: Present. denies leaking of fluid. Review of Systems:   Pertinent items are noted in HPI Denies abnormal vaginal discharge w/ itching/odor/irritation, headaches, visual changes, shortness of breath, chest pain, abdominal pain, severe nausea/vomiting, or problems with urination or bowel movements unless otherwise stated above. Pertinent History Reviewed:  Reviewed past medical,surgical, social, obstetrical and family history.  Reviewed problem list, medications and allergies. Physical Assessment:   Vitals:   07/24/23 1424  BP: 113/76  Pulse: 99  Weight: 169 lb (76.7 kg)  Body mass index is 29.94 kg/m.        Physical Examination:   General appearance: Well appearing, and in no distress  Mental status: Alert, oriented to person, place, and time  Skin: Warm & dry  Cardiovascular: Normal heart rate noted  Respiratory: Normal respiratory effort, no distress  Abdomen: Soft, gravid, nontender  Pelvic: Cervical exam deferred         Extremities: Edema: None Chaperone:  N/A   Fetal Status: Fetal Heart Rate (bpm): 148 Fundal Height: 32 cm Movement: Present      No results found for this or any previous visit (from the past 24 hours).  Assessment & Plan:    Pregnancy: G2P1001 at [redacted]w[redacted]d 1. Supervision of other normal pregnancy, antepartum (Primary)   2. [redacted] weeks gestation of pregnancy      Meds: No orders of the defined types were placed in this encounter.  Labs/procedures today: none  Plan:  Continue routine  obstetrical care  Next visit: prefers in person    Reviewed: Preterm labor symptoms and general obstetric precautions including but not limited to vaginal bleeding, contractions, leaking of fluid and fetal movement were reviewed in detail with the patient.  All questions were answered. Has home bp cuff. R. Check bp weekly, let us know if >140/90.   Follow-up: Return in about 2 weeks (around 08/07/2023) for LROB.  No future appointments.  Orders Placed This Encounter  Procedures   Tdap vaccine greater than or equal to 7yo IM   Jacklyn Shell DNP, CNM 07/24/2023 2:55 PM

## 2023-07-24 NOTE — Patient Instructions (Signed)
Carly Bates, I greatly value your feedback.  If you receive a survey following your visit with [name] today, we appreciate you taking the time to fill it out.  Thanks, Cathie Beams, DNP, CNM  [facility] HAS MOVED!!! It is now [facility] at [facility] ([address] [place], [place] [address]) Entrance located off of [address] Free 24/7 valet parking   Go to [email or web address] to register for FREE online childbirth classes    Call the office [phone]) or go to [facility] if: You begin to have strong, frequent contractions Your water breaks.  Sometimes it is a big gush of fluid, sometimes it is just a trickle that keeps getting your panties wet or running down your legs You have vaginal bleeding.  It is normal to have a small amount of spotting if your cervix was checked.  You don't feel your baby moving like normal.  If you don't, get you something to eat and drink and lay down and focus on feeling your baby move.  You should feel at least 10 movements in 2 hours.  If you don't, you should call the office or go to [facility].   Home Blood Pressure Monitoring for Patients   Your provider has recommended that you check your blood pressure (BP) at least once a week at home. If you do not have a blood pressure cuff at home, one will be provided for you. Contact your provider if you have not received your monitor within 1 week.   Helpful Tips for Accurate Home Blood Pressure Checks  Don't smoke, exercise, or drink caffeine 30 minutes before checking your BP Use the restroom before checking your BP (a full bladder can raise your pressure) Relax in a comfortable upright chair Feet on the ground Left arm resting comfortably on a flat surface at the level of your heart Legs uncrossed Back supported Sit quietly and don't talk Place the cuff on your bare arm Adjust snuggly, so that only two fingertips can fit between your skin and the top  of the cuff Check 2 readings separated by at least one minute Keep a log of your BP readings For a visual, please reference this diagram: http://ccnc.care/bpdiagram  Provider Name: [facility]     Phone: [phone]  Zone 1: ALL CLEAR  Continue to monitor your symptoms:  BP reading is less than 140 (top number) or less than 90 (bottom number)  No right upper stomach pain No headaches or seeing spots No feeling nauseated or throwing up No swelling in face and hands  Zone 2: CAUTION Call your doctor's office for any of the following:  BP reading is greater than 140 (top number) or greater than 90 (bottom number)  Stomach pain under your ribs in the middle or right side Headaches or seeing spots Feeling nauseated or throwing up Swelling in face and hands  Zone 3: EMERGENCY  Seek immediate medical care if you have any of the following:  BP reading is greater than160 (top number) or greater than 110 (bottom number) Severe headaches not improving with Tylenol Serious difficulty catching your breath Any worsening symptoms from Zone 2

## 2023-08-02 NOTE — L&D Delivery Note (Signed)
 OB/GYN Faculty Practice Delivery Note  Carly Bates is a 23 y.o. G2P1001 s/p NVSB at [redacted]w[redacted]d. She was admitted for SOL.   ROM: 7h 74m with clear fluid GBS Status: positive with adequate treatment Maximum Maternal Temperature: 98.7  Labor Progress: Admitted in latent phase of labor, progressed s/p AROM to complete and ready to push.   Delivery Date/Time: 09/11/2023 @ 1709 Delivery: Called to room and patient was complete and pushing. Head delivered OA to LOA. No nuchal cord present. Shoulder and body delivered in usual fashion. Infant with spontaneous cry, placed on mother's abdomen, dried and stimulated. Cord clamped x 2 after 1-minute delay, and cut by FOB. Cord blood drawn. Placenta delivered spontaneously, intact, with 3-vessel cord. Fundus firm with massage and Pitocin . Labia, perineum, vagina, and cervix inspected, bilateral labial and single 1st degree perineal laceration found noted to be hemostatic, no repair indicated.   Placenta: complete, spontaneous, Shultz, to L&D  Complications: none Lacerations: hemostatic bilateral labial and 1st degree perineal, no repair indicated EBL: 404 Analgesia: epidural  none Postpartum Planning [employer] ] transfer orders to MB [ X] discharge summary started & shared [employer] ] message to sent to schedule follow-up  [ X] lists updated  Infant: girl  APGARs 9/9  pending   Post-Placental IUD Insertion Procedure Note  Patient identified, informed consent signed prior to delivery, signed copy in chart, time out was performed.    Mirena  - IUD grasped between sterile gloved fingers. Sterile lubrication applied to sterile gloved hand for ease of insertion. Fundus identified through abdominal wall using non-insertion hand. IUD inserted to fundus with bimanual technique. IUD carefully released at the fundus and insertion hand gently removed from vagina.   Strings trimmed to the level of the introitus. Patient tolerated procedure well.  Patient given post  procedure instructions and IUD care card with expiration date.  Patient is asked to keep IUD strings tucked in her vagina until her postpartum follow up visit in 4-6 weeks. Patient advised to abstain from sexual intercourse and pulling on strings before her follow-up visit. Patient verbalized an understanding of the plan of care and agrees.    Raford Bunk, MSN, CNM, RNC-OB Certified Nurse Midwife, [facility] 09/11/2023 5:42 PM

## 2023-08-04 ENCOUNTER — Encounter: Payer: Self-pay | Admitting: Advanced Practice Midwife

## 2023-08-07 ENCOUNTER — Encounter: Payer: Medicaid Other | Admitting: Obstetrics & Gynecology

## 2023-08-10 ENCOUNTER — Ambulatory Visit (INDEPENDENT_AMBULATORY_CARE_PROVIDER_SITE_OTHER): Payer: Medicaid Other | Admitting: Advanced Practice Midwife

## 2023-08-10 ENCOUNTER — Ambulatory Visit: Payer: Medicaid Other | Admitting: Advanced Practice Midwife

## 2023-08-10 ENCOUNTER — Encounter: Payer: Self-pay | Admitting: Advanced Practice Midwife

## 2023-08-10 VITALS — BP 122/82 | HR 83 | Wt 167.0 lb

## 2023-08-10 DIAGNOSIS — Z3A34 34 weeks gestation of pregnancy: Secondary | ICD-10-CM

## 2023-08-10 DIAGNOSIS — Z3483 Encounter for supervision of other normal pregnancy, third trimester: Secondary | ICD-10-CM

## 2023-08-10 DIAGNOSIS — Z3480 Encounter for supervision of other normal pregnancy, unspecified trimester: Secondary | ICD-10-CM

## 2023-08-10 MED ORDER — ONDANSETRON 4 MG PO TBDP: 4.0000 mg | ORAL_TABLET | Freq: Four times a day (QID) | ORAL | 2 refills | Status: DC | PRN

## 2023-08-10 MED ORDER — PANTOPRAZOLE SODIUM 20 MG PO TBEC: 20.0000 mg | DELAYED_RELEASE_TABLET | Freq: Every day | ORAL | 3 refills | Status: DC

## 2023-08-10 NOTE — Patient Instructions (Signed)

## 2023-08-10 NOTE — Progress Notes (Signed)
   LOW-RISK PREGNANCY VISIT Patient name: Carly Bates MRN [id number]  Date of birth: 2001-02-11 Chief Complaint:   No chief complaint on file.  History of Present Illness:   Carly Bates is a 23 y.o. G61P1001 female at [redacted]w[redacted]d with an Estimated Date of Delivery: 09/18/23 being seen today for ongoing management of a low-risk pregnancy.  Today she reports leaking a yellow substance off an on for a few days. .  .  .   .  Review of Systems:   Pertinent items are noted in HPI Denies abnormal vaginal discharge w/ itching/odor/irritation, headaches, visual changes, shortness of breath, chest pain, abdominal pain, severe nausea/vomiting, or problems with urination or bowel movements unless otherwise stated above. Pertinent History Reviewed:  Reviewed past medical,surgical, social, obstetrical and family history.  Reviewed problem list, medications and allergies. Physical Assessment:  There were no vitals filed for this visit.There is no height or weight on file to calculate BMI.        Physical Examination:   General appearance: Well appearing, and in no distress  Mental status: Alert, oriented to person, place, and time  Skin: Warm & dry  Cardiovascular: Normal heart rate noted  Respiratory: Normal respiratory effort, no distress  Abdomen: Soft, gravid, nontender  Pelvic: Cervical exam performed SSE:  Normal appearing white dc w/o odor.  Negative fern, nitrazine and valsalva       Extremities:   Chaperone:  Peggy Dones   Fetal Status: Fetal Heart Rate (bpm): 141 Fundal Height: 34 cm        No results found for this or any previous visit (from the past 24 hours).  Assessment & Plan:    Pregnancy: G2P1001 at [redacted]w[redacted]d 1. Supervision of other normal pregnancy, antepartum (Primary)  No evidence of ROM     Meds:  Meds ordered this encounter  Medications   pantoprazole  (PROTONIX ) 20 MG tablet    Sig: Take 1 tablet (20 mg total) by mouth daily.    Dispense:  3 tablet    Refill:  3     Supervising Provider:   JAYNE MINDER H [[id number]]   ondansetron  (ZOFRAN -ODT) 4 MG disintegrating tablet    Sig: Take 1 tablet (4 mg total) by mouth every 6 (six) hours as needed for nausea or vomiting.    Dispense:  40 tablet    Refill:  2    Pt prefers name brand--I can't find it in EPIC, please substitute name brand (ODT)    Supervising Provider:   JAYNE MINDER DEL [[id number]]   Labs/procedures today: fern, nitrazine  Plan:  Continue routine obstetrical care  Next visit: prefers in person    Reviewed: Preterm labor symptoms and general obstetric precautions including but not limited to vaginal bleeding, contractions, leaking of fluid and fetal movement were reviewed in detail with the patient.  All questions were answered. Has home bp cuff. . Check bp weekly, let us  know if >140/90.   Follow-up: Return for As scheduled.  Future Appointments  Date Time Provider Department Center  08/21/2023  9:10 AM Kizzie Suzen [place], [place] [facility]  08/28/2023 10:50 AM Kizzie Suzen SAUNDERS, CNM [facility]  09/04/2023 10:50 AM Kizzie Suzen [place]UN[place], CNM [facility]  09/11/2023  9:50 AM Kizzie Suzen [place]UN[place], CNM [facility]  09/18/2023  9:10 AM Cresenzo-Dishmon, Cathlean, CNM [facility]    No orders of the defined types were placed in this encounter.  Cathlean Ely DNP, CNM 08/10/2023 10:31 AM

## 2023-08-21 ENCOUNTER — Encounter: Payer: Medicaid Other | Admitting: Women's Health

## 2023-08-28 ENCOUNTER — Ambulatory Visit (INDEPENDENT_AMBULATORY_CARE_PROVIDER_SITE_OTHER): Payer: Medicaid Other | Admitting: Women's Health

## 2023-08-28 ENCOUNTER — Other Ambulatory Visit (HOSPITAL_COMMUNITY)
Admission: RE | Admit: 2023-08-28 | Discharge: 2023-08-28 | Disposition: A | Discharge status: Home / Self Care | Payer: Medicaid Other | Source: Ambulatory Visit | Attending: Women's Health | Admitting: Women's Health

## 2023-08-28 ENCOUNTER — Encounter: Payer: Self-pay | Admitting: Women's Health

## 2023-08-28 VITALS — BP 114/79 | HR 83 | Wt 166.0 lb

## 2023-08-28 DIAGNOSIS — Z3480 Encounter for supervision of other normal pregnancy, unspecified trimester: Secondary | ICD-10-CM

## 2023-08-28 DIAGNOSIS — Z3493 Encounter for supervision of normal pregnancy, unspecified, third trimester: Secondary | ICD-10-CM | POA: Diagnosis not present

## 2023-08-28 DIAGNOSIS — Z3483 Encounter for supervision of other normal pregnancy, third trimester: Secondary | ICD-10-CM | POA: Diagnosis not present

## 2023-08-28 DIAGNOSIS — Z3A37 37 weeks gestation of pregnancy: Secondary | ICD-10-CM | POA: Diagnosis not present

## 2023-08-28 LAB — OB RESULTS CONSOLE GBS: GBS: POSITIVE

## 2023-08-28 NOTE — Patient Instructions (Signed)
Carly Bates, thank you for choosing our office today! We appreciate the opportunity to meet your healthcare needs. You may receive a short survey by mail, e-mail, or through [employer]. If you are happy with your care we would appreciate if you could take just a few minutes to complete the survey questions. We read all of your comments and take your feedback very seriously. Thank you again for choosing our office.  Center for [employer] Team at [facility]  [facility] at [facility] ([address] Carly Bates, Carly Bates) Entrance C, located off of [address] Free 24/7 valet parking   CLASSES: Go to [email or web address] to register for classes (childbirth, breastfeeding, waterbirth, infant CPR, daddy bootcamp, etc.)  Call the office [phone]) or go to [facility] if: You begin to have strong, frequent contractions Your water breaks.  Sometimes it is a big gush of fluid, sometimes it is just a trickle that keeps getting your panties wet or running down your legs You have vaginal bleeding.  It is normal to have a small amount of spotting if your cervix was checked.  You don't feel your baby moving like normal.  If you don't, get you something to eat and drink and lay down and focus on feeling your baby move.   If your baby is still not moving like normal, you should call the office or go to [facility].  Call the office [phone]) or go to [facility] hospital for these signs of pre-eclampsia: Severe headache that does not go away with Tylenol Visual changes- seeing spots, double, blurred vision Pain under your right breast or upper abdomen that does not go away with Tums or heartburn medicine Nausea and/or vomiting Severe swelling in your hands, feet, and face   [facility] Pediatricians/Family Doctors [facility]): [address] [place]lette Bates, [phone]           [facility]: [address], [phone]                 [facility]): [address], [phone] (call to ask if accepting patients) [facility]: [address] [place], [phone]    [facility] Pediatricians/Family Doctors [facility]): [address], [phone] [facility]: [address] [place], [phone] [facility]: [address], [phone]  [facility] Doctors  [facility]): [phone] [facility]: [address] [phone]   [facility] Doctors [facility]: [address], [phone]  [facility] Doctors  [facility]: [phone], [phone]  Home Blood Pressure Monitoring for Patients   Your provider has recommended that you check your blood pressure (BP) at least once a week at home. If you do not have a blood pressure cuff at home, one will be provided for you. Contact your provider if you have not received your monitor within 1 week.   Helpful Tips for Accurate Home Blood Pressure Checks  Don't smoke, exercise, or drink caffeine 30 minutes before checking your BP Use the restroom before checking your BP (a full bladder can raise your pressure) Relax in a comfortable upright chair Feet on the ground Left arm resting comfortably on a flat surface at the level of your heart Legs uncrossed Back supported Sit quietly and don't talk Place the cuff on your bare arm Adjust snuggly, so that only two fingertips  can fit between your skin and the top of the cuff Check 2 readings separated by at least one minute Keep a log of your BP readings For a visual, please reference this diagram: http://ccnc.care/bpdiagram  Provider Name: [facility]     Phone: [phone]  Zone 1: ALL CLEAR  Continue to monitor your symptoms:  BP reading is less than 140 (top number) or less than 90 (bottom number)  No right  upper stomach pain No headaches or seeing spots No feeling nauseated or throwing up No swelling in face and hands  Zone 2: CAUTION Call your doctor's office for any of the following:  BP reading is greater than 140 (top number) or greater than 90 (bottom number)  Stomach pain under your ribs in the middle or right side Headaches or seeing spots Feeling nauseated or throwing up Swelling in face and hands  Zone 3: EMERGENCY  Seek immediate medical care if you have any of the following:  BP reading is greater than160 (top number) or greater than 110 (bottom number) Severe headaches not improving with Tylenol Serious difficulty catching your breath Any worsening symptoms from Zone 2   Braxton Hicks Contractions Contractions of the uterus can occur throughout pregnancy, but they are not always a sign that you are in labor. You may have practice contractions called Braxton Hicks contractions. These false labor contractions are sometimes confused with true labor. What are [name] contractions? Braxton Hicks contractions are tightening movements that occur in the muscles of the uterus before labor. Unlike true labor contractions, these contractions do not result in opening (dilation) and thinning of the cervix. Toward the end of pregnancy (32-34 weeks), Braxton Hicks contractions can happen more often and may become stronger. These contractions are sometimes difficult to tell apart from true labor because they can be very uncomfortable. You should not feel embarrassed if you go to the hospital with false labor. Sometimes, the only way to tell if you are in true labor is for your health care provider to look for changes in the cervix. The health care provider will do a physical exam and may monitor your contractions. If you are not in true labor, the exam should show that your cervix is not dilating and your water has not broken. If there are no other health problems associated with your  pregnancy, it is completely safe for you to be sent home with false labor. You may continue to have Braxton Hicks contractions until you go into true labor. How to tell the difference between true labor and false labor True labor Contractions last 30-70 seconds. Contractions become very regular. Discomfort is usually felt in the top of the uterus, and it spreads to the lower abdomen and low back. Contractions do not go away with walking. Contractions usually become more intense and increase in frequency. The cervix dilates and gets thinner. False labor Contractions are usually shorter and not as strong as true labor contractions. Contractions are usually irregular. Contractions are often felt in the front of the lower abdomen and in the groin. Contractions may go away when you walk around or change positions while lying down. Contractions get weaker and are shorter-lasting as time goes on. The cervix usually does not dilate or become thin. Follow these instructions at home:  Take over-the-counter and prescription medicines only as told by your health care provider. Keep up with your usual exercises and follow other instructions from your health care provider. Eat and drink lightly if you think  you are going into labor. If Braxton Hicks contractions are making you uncomfortable: Change your position from lying down or resting to walking, or change from walking to resting. Sit and rest in a tub of warm water. Drink enough fluid to keep your urine pale yellow. Dehydration may cause these contractions. Do slow and deep breathing several times an hour. Keep all follow-up prenatal visits as told by your health care provider. This is important. Contact a health care provider if: You have a fever. You have continuous pain in your abdomen. Get help right away if: Your contractions become stronger, more regular, and closer together. You have fluid leaking or gushing from your vagina. You pass  blood-tinged mucus (bloody show). You have bleeding from your vagina. You have low back pain that you never had before. You feel your baby's head pushing down and causing pelvic pressure. Your baby is not moving inside you as much as it used to. Summary Contractions that occur before labor are called Braxton Hicks contractions, false labor, or practice contractions. Braxton Hicks contractions are usually shorter, weaker, farther apart, and less regular than true labor contractions. True labor contractions usually become progressively stronger and regular, and they become more frequent. Manage discomfort from [facility] contractions by changing position, resting in a warm bath, drinking plenty of water, or practicing deep breathing. This information is not intended to replace advice given to you by your health care provider. Make sure you discuss any questions you have with your health care provider. Document Revised: 06/30/2017 Document Reviewed: 12/01/2016 Elsevier Patient Education  2020 [employer].

## 2023-08-28 NOTE — Progress Notes (Signed)
LOW-RISK PREGNANCY VISIT Patient name: Carly Bates MRN [id number]  Date of birth: 2000-09-28 Chief Complaint:   Routine Prenatal Visit (Culture , cervical check)  History of Present Illness:   Carly Bates is a 23 y.o. G50P1001 female at [redacted]w[redacted]d with an Estimated Date of Delivery: 09/18/23 being seen today for ongoing management of a low-risk pregnancy.   Today she reports no complaints. Contractions: Irregular.  .  Movement: Present. denies leaking of fluid.     06/26/2023   11:40 AM 03/14/2023    9:23 AM 01/18/2023    2:20 PM 09/27/2021   10:07 AM 06/14/2021   10:01 AM  Depression screen PHQ 2/9  Decreased Interest 2 0 0 2 2  Down, Depressed, Hopeless 1 0 0 1 0  PHQ - 2 Score 3 0 0 3 2  Altered sleeping 1 0 0 1 1  Tired, decreased energy 1 0 0 2 1  Change in appetite 3 3 2 3 1   Feeling bad or failure about yourself  0 0 0 1 0  Trouble concentrating 0 1 0 2 0  Moving slowly or fidgety/restless 0 0 0 0 0  Suicidal thoughts 0 0 0 0 0  PHQ-9 Score 8 4 2 12 5         06/26/2023   11:42 AM 03/14/2023    9:23 AM 01/18/2023    2:21 PM 09/27/2021   10:07 AM  GAD 7 : Generalized Anxiety Score  Nervous, Anxious, on Edge 0 0 0 1  Control/stop worrying 1 0 0 1  Worry too much - different things 1 0 0 1  Trouble relaxing 0 0 0 1  Restless 0 0 0 0  Easily annoyed or irritable 0 0 0 1  Afraid - awful might happen 0 0 0 0  Total GAD 7 Score 2 0 0 5      Review of Systems:   Pertinent items are noted in HPI Denies abnormal vaginal discharge w/ itching/odor/irritation, headaches, visual changes, shortness of breath, chest pain, abdominal pain, severe nausea/vomiting, or problems with urination or bowel movements unless otherwise stated above. Pertinent History Reviewed:  Reviewed past medical,surgical, social, obstetrical and family history.  Reviewed problem list, medications and allergies. Physical Assessment:   Vitals:   08/28/23 1101  BP: 114/79  Pulse: 83  Weight: 166  lb (75.3 kg)  Body mass index is 29.41 kg/m.        Physical Examination:   General appearance: Well appearing, and in no distress  Mental status: Alert, oriented to person, place, and time  Skin: Warm & dry  Cardiovascular: Normal heart rate noted  Respiratory: Normal respiratory effort, no distress  Abdomen: Soft, gravid, nontender  Pelvic: Cervical exam performed  Dilation: 1 Effacement (%): Thick Station: -3  Extremities: Edema: None  Fetal Status: Fetal Heart Rate (bpm): 138 Fundal Height: 35 cm Movement: Present Presentation: Vertex  Chaperone: Peggy Dones   No results found for this or any previous visit (from the past 24 hours).  Assessment & Plan:  1) Low-risk pregnancy G2P1001 at [redacted]w[redacted]d with an Estimated Date of Delivery: 09/18/23   2) H/O GHTN & PPHTN, check bp daily, let us know if >140/90, reviewed pre-e s/s, reasons to seek care   Meds: No orders of the defined types were placed in this encounter.  Labs/procedures today: GBS, GC/CT, and SVE  Plan:  Continue routine obstetrical care  Next visit: prefers in person    Reviewed: Term labor symptoms and general  obstetric precautions including but not limited to vaginal bleeding, contractions, leaking of fluid and fetal movement were reviewed in detail with the patient.  All questions were answered. Does have home bp cuff. Office bp cuff given: not applicable. Check bp daily, let us know if consistently >140 and/or >90.  Follow-up: Return for As scheduled.  Future Appointments  Date Time Provider Department Center  09/04/2023 10:50 AM Cheral Marker, CNM [facility]  09/11/2023  9:50 AM Cheral Marker, CNM [facility]  09/18/2023  9:10 AM Jacklyn Shell, CNM [facility]    Orders Placed This Encounter  Procedures   Strep Gp B NAA+Rflx   Cheral Marker CNM, [facility] 08/28/2023 11:33 AM

## 2023-08-29 ENCOUNTER — Encounter: Payer: Self-pay | Admitting: Women's Health

## 2023-08-29 LAB — CERVICOVAGINAL ANCILLARY ONLY
Bacterial Vaginitis (gardnerella): NEGATIVE
Candida Glabrata: NEGATIVE
Candida Vaginitis: POSITIVE — AB
Chlamydia: NEGATIVE
Comment: NEGATIVE
Comment: NEGATIVE
Comment: NEGATIVE
Comment: NEGATIVE
Comment: NEGATIVE
Comment: NORMAL
Neisseria Gonorrhea: NEGATIVE
Trichomonas: NEGATIVE

## 2023-08-29 MED ORDER — TERCONAZOLE 0.4 % VA CREA: 1.0000 | TOPICAL_CREAM | Freq: Every day | VAGINAL | 0 refills | Status: DC

## 2023-08-29 NOTE — Addendum Note (Signed)
Addended by: Cheral Marker on: 08/29/2023 02:05 PM   Modules accepted: Orders

## 2023-09-01 LAB — STREP GP B NAA+RFLX: Strep Gp B NAA+Rflx: POSITIVE — AB

## 2023-09-01 LAB — STREP GP B SUSCEPTIBILITY

## 2023-09-04 ENCOUNTER — Encounter: Payer: Self-pay | Admitting: Women's Health

## 2023-09-04 ENCOUNTER — Ambulatory Visit (INDEPENDENT_AMBULATORY_CARE_PROVIDER_SITE_OTHER): Payer: Medicaid Other | Admitting: Women's Health

## 2023-09-04 VITALS — BP 111/77 | HR 84 | Wt 166.4 lb

## 2023-09-04 DIAGNOSIS — Z3483 Encounter for supervision of other normal pregnancy, third trimester: Secondary | ICD-10-CM | POA: Diagnosis not present

## 2023-09-04 DIAGNOSIS — Z3A38 38 weeks gestation of pregnancy: Secondary | ICD-10-CM

## 2023-09-04 DIAGNOSIS — Z3480 Encounter for supervision of other normal pregnancy, unspecified trimester: Secondary | ICD-10-CM

## 2023-09-04 NOTE — Progress Notes (Signed)
LOW-RISK PREGNANCY VISIT Patient name: Carly Bates MRN [id number]  Date of birth: 04/30/2001 Chief Complaint:   Routine Prenatal Visit (Cervical check)  History of Present Illness:   Carly Bates is a 23 y.o. G46P1001 female at [redacted]w[redacted]d with an Estimated Date of Delivery: 09/18/23 being seen today for ongoing management of a low-risk pregnancy.   Today she reports occasional contractions. Contractions: Irregular.  .  Movement: Present. denies leaking of fluid.     06/26/2023   11:40 AM 03/14/2023    9:23 AM 01/18/2023    2:20 PM 09/27/2021   10:07 AM 06/14/2021   10:01 AM  Depression screen PHQ 2/9  Decreased Interest 2 0 0 2 2  Down, Depressed, Hopeless 1 0 0 1 0  PHQ - 2 Score 3 0 0 3 2  Altered sleeping 1 0 0 1 1  Tired, decreased energy 1 0 0 2 1  Change in appetite 3 3 2 3 1   Feeling bad or failure about yourself  0 0 0 1 0  Trouble concentrating 0 1 0 2 0  Moving slowly or fidgety/restless 0 0 0 0 0  Suicidal thoughts 0 0 0 0 0  PHQ-9 Score 8 4 2 12 5         06/26/2023   11:42 AM 03/14/2023    9:23 AM 01/18/2023    2:21 PM 09/27/2021   10:07 AM  GAD 7 : Generalized Anxiety Score  Nervous, Anxious, on Edge 0 0 0 1  Control/stop worrying 1 0 0 1  Worry too much - different things 1 0 0 1  Trouble relaxing 0 0 0 1  Restless 0 0 0 0  Easily annoyed or irritable 0 0 0 1  Afraid - awful might happen 0 0 0 0  Total GAD 7 Score 2 0 0 5      Review of Systems:   Pertinent items are noted in HPI Denies abnormal vaginal discharge w/ itching/odor/irritation, headaches, visual changes, shortness of breath, chest pain, abdominal pain, severe nausea/vomiting, or problems with urination or bowel movements unless otherwise stated above. Pertinent History Reviewed:  Reviewed past medical,surgical, social, obstetrical and family history.  Reviewed problem list, medications and allergies. Physical Assessment:   Vitals:   09/04/23 1042  BP: 111/77  Pulse: 84  Weight: 166  lb 6.4 oz (75.5 kg)  Body mass index is 29.48 kg/m.        Physical Examination:   General appearance: Well appearing, and in no distress  Mental status: Alert, oriented to person, place, and time  Skin: Warm & dry  Cardiovascular: Normal heart rate noted  Respiratory: Normal respiratory effort, no distress  Abdomen: Soft, gravid, nontender  Pelvic: Cervical exam deferred         Extremities: Edema: None  Fetal Status: Fetal Heart Rate (bpm): 141 Fundal Height: 36 cm Movement: Present    Chaperone: N/A   No results found for this or any previous visit (from the past 24 hours).  Assessment & Plan:  1) Low-risk pregnancy G2P1001 at [redacted]w[redacted]d with an Estimated Date of Delivery: 09/18/23   2) H/O gHTN/PPHTN, bp great, reviewed pre-e s/s, reasons to seek care   Meds: No orders of the defined types were placed in this encounter.  Labs/procedures today: SVE  Plan:  Continue routine obstetrical care  Next visit: prefers in person    Reviewed: Term labor symptoms and general obstetric precautions including but not limited to vaginal bleeding, contractions, leaking of fluid  and fetal movement were reviewed in detail with the patient.  All questions were answered. Does have home bp cuff. Office bp cuff given: not applicable. Check bp daily, let us know if consistently >140 and/or >90.  Follow-up: Return for As scheduled.  Future Appointments  Date Time Provider Department Center  09/11/2023  9:50 AM Cheral Marker, CNM [facility]  09/18/2023  9:10 AM Cresenzo-Dishmon, Scarlette Calico, CNM [facility]    No orders of the defined types were placed in this encounter.  Cheral Marker CNM, [facility] 09/04/2023 11:12 AM

## 2023-09-04 NOTE — Patient Instructions (Signed)
 Carly Bates, thank you for choosing our office today! We appreciate the opportunity to meet your healthcare needs. You may receive a short survey by mail, e-mail, or through [employer]. If you are happy with your care we would appreciate if you could take just a few minutes to complete the survey questions. We read all of your comments and take your feedback very seriously. Thank you again for choosing our office.  Center for [employer] Team at [facility]  [facility] at [facility] ([address] [place], [place] [address]) Entrance C, located off of [address] Free 24/7 valet parking   CLASSES: Go to [email or web address] to register for classes (childbirth, breastfeeding, waterbirth, infant CPR, daddy bootcamp, etc.)  Call the office [phone]) or go to [facility] if: You begin to have strong, frequent contractions Your water breaks.  Sometimes it is a big gush of fluid, sometimes it is just a trickle that keeps getting your panties wet or running down your legs You have vaginal bleeding.  It is normal to have a small amount of spotting if your cervix was checked.  You don't feel your baby moving like normal.  If you don't, get you something to eat and drink and lay down and focus on feeling your baby move.   If your baby is still not moving like normal, you should call the office or go to [facility].  Call the office [phone]) or go to [facility] for these signs of pre-eclampsia: Severe headache that does not go away with Tylenol Visual changes- seeing spots, double, blurred vision Pain under your right breast or upper abdomen that does not go away with Tums or heartburn medicine Nausea and/or vomiting Severe swelling in your hands, feet, and face   [facility] Pediatricians/Family Doctors [facility]): [address] Colette Bates, [phone]           [facility]: [address], [phone]                 [facility]): [address], [phone] (call to ask if accepting patients) [facility]: [address] [place], [phone]    [facility] Pediatricians/Family Doctors [facility]): [address], [phone] [facility]: [address] [place], [phone] [facility]: [address], [phone]  [facility] Doctors  [facility]): [phone] [facility]: [address] [phone]   [facility] Doctors [facility]: [address], [phone]  [facility] Doctors  [facility]: [phone], [phone]  Home Blood Pressure Monitoring for Patients   Your provider has recommended that you check your blood pressure (BP) at least once a week at home. If you do not have a blood pressure cuff at home, one will be provided for you. Contact your provider if you have not received your monitor within 1 week.   Helpful Tips for Accurate Home Blood Pressure Checks  Don't smoke, exercise, or drink caffeine 30 minutes before checking your BP Use the restroom before checking your BP (a full bladder can raise your pressure) Relax in a comfortable upright chair Feet on the ground Left arm resting comfortably on a flat surface at the level of your heart Legs uncrossed Back supported Sit quietly and don't talk Place the cuff on your bare arm Adjust snuggly, so that only two fingertips  can fit between your skin and the top of the cuff Check 2 readings separated by at least one minute Keep a log of your BP readings For a visual, please reference this diagram: http://ccnc.care/bpdiagram  Provider Name: [facility]     Phone: [phone]  Zone 1: ALL CLEAR  Continue to monitor your symptoms:  BP reading is less than 140 (top number) or less than 90 (bottom number)  No right  upper stomach pain No headaches or seeing spots No feeling nauseated or throwing up No swelling in face and hands  Zone 2: CAUTION Call your doctor's office for any of the following:  BP reading is greater than 140 (top number) or greater than 90 (bottom number)  Stomach pain under your ribs in the middle or right side Headaches or seeing spots Feeling nauseated or throwing up Swelling in face and hands  Zone 3: EMERGENCY  Seek immediate medical care if you have any of the following:  BP reading is greater than160 (top number) or greater than 110 (bottom number) Severe headaches not improving with Tylenol Serious difficulty catching your breath Any worsening symptoms from Zone 2   Braxton Hicks Contractions Contractions of the uterus can occur throughout pregnancy, but they are not always a sign that you are in labor. You may have practice contractions called Braxton Hicks contractions. These false labor contractions are sometimes confused with true labor. What are Carly Bates contractions? Braxton Hicks contractions are tightening movements that occur in the muscles of the uterus before labor. Unlike true labor contractions, these contractions do not result in opening (dilation) and thinning of the cervix. Toward the end of pregnancy (32-34 weeks), Braxton Hicks contractions can happen more often and may become stronger. These contractions are sometimes difficult to tell apart from true labor because they can be very uncomfortable. You should not feel embarrassed if you go to the hospital with false labor. Sometimes, the only way to tell if you are in true labor is for your health care provider to look for changes in the cervix. The health care provider will do a physical exam and may monitor your contractions. If you are not in true labor, the exam should show that your cervix is not dilating and your water has not broken. If there are no other health problems associated with your  pregnancy, it is completely safe for you to be sent home with false labor. You may continue to have Braxton Hicks contractions until you go into true labor. How to tell the difference between true labor and false labor True labor Contractions last 30-70 seconds. Contractions become very regular. Discomfort is usually felt in the top of the uterus, and it spreads to the lower abdomen and low back. Contractions do not go away with walking. Contractions usually become more intense and increase in frequency. The cervix dilates and gets thinner. False labor Contractions are usually shorter and not as strong as true labor contractions. Contractions are usually irregular. Contractions are often felt in the front of the lower abdomen and in the groin. Contractions may go away when you walk around or change positions while lying down. Contractions get weaker and are shorter-lasting as time goes on. The cervix usually does not dilate or become thin. Follow these instructions at home:  Take over-the-counter and prescription medicines only as told by your health care provider. Keep up with your usual exercises and follow other instructions from your health care provider. Eat and drink lightly if you think  you are going into labor. If Braxton Hicks contractions are making you uncomfortable: Change your position from lying down or resting to walking, or change from walking to resting. Sit and rest in a tub of warm water. Drink enough fluid to keep your urine pale yellow. Dehydration may cause these contractions. Do slow and deep breathing several times an hour. Keep all follow-up prenatal visits as told by your health care provider. This is important. Contact a health care provider if: You have a fever. You have continuous pain in your abdomen. Get help right away if: Your contractions become stronger, more regular, and closer together. You have fluid leaking or gushing from your vagina. You pass  blood-tinged mucus (bloody show). You have bleeding from your vagina. You have low back pain that you never had before. You feel your baby's head pushing down and causing pelvic pressure. Your baby is not moving inside you as much as it used to. Summary Contractions that occur before labor are called Braxton Hicks contractions, false labor, or practice contractions. Braxton Hicks contractions are usually shorter, weaker, farther apart, and less regular than true labor contractions. True labor contractions usually become progressively stronger and regular, and they become more frequent. Manage discomfort from [facility] contractions by changing position, resting in a warm bath, drinking plenty of water, or practicing deep breathing. This information is not intended to replace advice given to you by your health care provider. Make sure you discuss any questions you have with your health care provider. Document Revised: 06/30/2017 Document Reviewed: 12/01/2016 Elsevier Patient Education  2020 [employer].

## 2023-09-11 ENCOUNTER — Encounter: Payer: Medicaid Other | Admitting: Obstetrics & Gynecology

## 2023-09-11 ENCOUNTER — Inpatient Hospital Stay (HOSPITAL_COMMUNITY): Payer: Medicaid Other | Admitting: Anesthesiology

## 2023-09-11 ENCOUNTER — Encounter (HOSPITAL_COMMUNITY): Payer: Self-pay | Admitting: Obstetrics and Gynecology

## 2023-09-11 ENCOUNTER — Other Ambulatory Visit: Payer: Self-pay

## 2023-09-11 ENCOUNTER — Inpatient Hospital Stay (HOSPITAL_COMMUNITY)
Admission: AD | Admit: 2023-09-11 | Discharge: 2023-09-12 | DRG: 806 | Disposition: A | Discharge status: Home / Self Care | Payer: Medicaid Other | Attending: Family Medicine | Admitting: Family Medicine

## 2023-09-11 DIAGNOSIS — O4103X0 Oligohydramnios, third trimester, not applicable or unspecified: Secondary | ICD-10-CM

## 2023-09-11 DIAGNOSIS — Z880 Allergy status to penicillin: Secondary | ICD-10-CM

## 2023-09-11 DIAGNOSIS — O99824 Streptococcus B carrier state complicating childbirth: Secondary | ICD-10-CM | POA: Diagnosis not present

## 2023-09-11 DIAGNOSIS — Z3480 Encounter for supervision of other normal pregnancy, unspecified trimester: Principal | ICD-10-CM

## 2023-09-11 DIAGNOSIS — O26893 Other specified pregnancy related conditions, third trimester: Secondary | ICD-10-CM | POA: Diagnosis not present

## 2023-09-11 DIAGNOSIS — Z7982 Long term (current) use of aspirin: Secondary | ICD-10-CM | POA: Diagnosis not present

## 2023-09-11 DIAGNOSIS — O99820 Streptococcus B carrier state complicating pregnancy: Secondary | ICD-10-CM | POA: Diagnosis not present

## 2023-09-11 DIAGNOSIS — Z30430 Encounter for insertion of intrauterine contraceptive device: Secondary | ICD-10-CM

## 2023-09-11 DIAGNOSIS — Z8249 Family history of ischemic heart disease and other diseases of the circulatory system: Secondary | ICD-10-CM

## 2023-09-11 DIAGNOSIS — D62 Acute posthemorrhagic anemia: Secondary | ICD-10-CM | POA: Diagnosis not present

## 2023-09-11 DIAGNOSIS — O9952 Diseases of the respiratory system complicating childbirth: Secondary | ICD-10-CM | POA: Diagnosis not present

## 2023-09-11 DIAGNOSIS — Z833 Family history of diabetes mellitus: Secondary | ICD-10-CM

## 2023-09-11 DIAGNOSIS — Z8674 Personal history of sudden cardiac arrest: Secondary | ICD-10-CM | POA: Diagnosis not present

## 2023-09-11 DIAGNOSIS — Z3490 Encounter for supervision of normal pregnancy, unspecified, unspecified trimester: Secondary | ICD-10-CM

## 2023-09-11 DIAGNOSIS — O9081 Anemia of the puerperium: Secondary | ICD-10-CM | POA: Diagnosis not present

## 2023-09-11 DIAGNOSIS — J4520 Mild intermittent asthma, uncomplicated: Secondary | ICD-10-CM | POA: Diagnosis not present

## 2023-09-11 DIAGNOSIS — Z3A39 39 weeks gestation of pregnancy: Secondary | ICD-10-CM

## 2023-09-11 LAB — CBC
HCT: 34.5 % — ABNORMAL LOW (ref 36.0–46.0)
Hemoglobin: 11.1 g/dL — ABNORMAL LOW (ref 12.0–15.0)
MCH: 26.2 pg (ref 26.0–34.0)
MCHC: 32.2 g/dL (ref 30.0–36.0)
MCV: 81.6 fL (ref 80.0–100.0)
Platelets: 340 10*3/uL (ref 150–400)
RBC: 4.23 MIL/uL (ref 3.87–5.11)
RDW: 14.3 % (ref 11.5–15.5)
WBC: 7.7 10*3/uL (ref 4.0–10.5)
nRBC: 0 % (ref 0.0–0.2)

## 2023-09-11 LAB — RPR: RPR Ser Ql: NONREACTIVE

## 2023-09-11 LAB — TYPE AND SCREEN
ABO/RH(D): O POS
Antibody Screen: NEGATIVE

## 2023-09-11 MED ORDER — ONDANSETRON 4 MG PO TBDP
4.0000 mg | ORAL_TABLET | Freq: Once | ORAL | Status: AC
  Administered 2023-09-11: 4 mg via ORAL
  Filled 2023-09-11: qty 1

## 2023-09-11 MED ORDER — SOD CITRATE-CITRIC ACID 500-334 MG/5ML PO SOLN: 30.0000 mL | ORAL | Status: DC | PRN

## 2023-09-11 MED ORDER — SIMETHICONE 80 MG PO CHEW: 80.0000 mg | CHEWABLE_TABLET | ORAL | Status: DC | PRN

## 2023-09-11 MED ORDER — OXYTOCIN-SODIUM CHLORIDE 30-0.9 UT/500ML-% IV SOLN
2.5000 [IU]/h | INTRAVENOUS | Status: DC
  Administered 2023-09-11: 2.5 [IU]/h via INTRAVENOUS
  Filled 2023-09-11: qty 500

## 2023-09-11 MED ORDER — SODIUM CHLORIDE 0.9 % IV SOLN: 250.0000 mL | INTRAVENOUS | Status: DC | PRN

## 2023-09-11 MED ORDER — LACTATED RINGERS IV SOLN
500.0000 mL | Freq: Once | INTRAVENOUS | Status: AC
  Administered 2023-09-11: 500 mL via INTRAVENOUS

## 2023-09-11 MED ORDER — SODIUM CHLORIDE 0.9% FLUSH: 3.0000 mL | Freq: Two times a day (BID) | INTRAVENOUS | Status: DC

## 2023-09-11 MED ORDER — IBUPROFEN 600 MG PO TABS
600.0000 mg | ORAL_TABLET | Freq: Four times a day (QID) | ORAL | Status: DC
  Administered 2023-09-11 – 2023-09-12 (×4): 600 mg via ORAL
  Filled 2023-09-11 (×4): qty 1

## 2023-09-11 MED ORDER — DIPHENHYDRAMINE HCL 25 MG PO CAPS: 25.0000 mg | ORAL_CAPSULE | Freq: Four times a day (QID) | ORAL | Status: DC | PRN

## 2023-09-11 MED ORDER — PHENYLEPHRINE 80 MCG/ML (10ML) SYRINGE FOR IV PUSH (FOR BLOOD PRESSURE SUPPORT): 80.0000 ug | PREFILLED_SYRINGE | INTRAVENOUS | Status: DC | PRN

## 2023-09-11 MED ORDER — COCONUT OIL OIL: 1.0000 | TOPICAL_OIL | Status: DC | PRN

## 2023-09-11 MED ORDER — FENTANYL CITRATE (PF) 100 MCG/2ML IJ SOLN: 50.0000 ug | INTRAMUSCULAR | Status: DC | PRN

## 2023-09-11 MED ORDER — EPHEDRINE 5 MG/ML INJ: 10.0000 mg | INTRAVENOUS | Status: DC | PRN

## 2023-09-11 MED ORDER — FENTANYL-BUPIVACAINE-NACL 0.5-0.125-0.9 MG/250ML-% EP SOLN
12.0000 mL/h | EPIDURAL | Status: DC | PRN
  Administered 2023-09-11: 12 mL/h via EPIDURAL
  Filled 2023-09-11: qty 250

## 2023-09-11 MED ORDER — BENZOCAINE-MENTHOL 20-0.5 % EX AERO
1.0000 | INHALATION_SPRAY | CUTANEOUS | Status: DC | PRN
Start: 2023-09-11 — End: 2023-09-13
  Administered 2023-09-11: 1 via TOPICAL
  Filled 2023-09-11: qty 56

## 2023-09-11 MED ORDER — ONDANSETRON HCL 4 MG/2ML IJ SOLN
4.0000 mg | Freq: Four times a day (QID) | INTRAMUSCULAR | Status: DC | PRN
  Administered 2023-09-11 (×2): 4 mg via INTRAVENOUS
  Filled 2023-09-11 (×2): qty 2

## 2023-09-11 MED ORDER — CEFAZOLIN SODIUM-DEXTROSE 2-4 GM/100ML-% IV SOLN
2.0000 g | Freq: Once | INTRAVENOUS | Status: AC
  Administered 2023-09-11: 2 g via INTRAVENOUS
  Filled 2023-09-11: qty 100

## 2023-09-11 MED ORDER — LEVONORGESTREL 20 MCG/DAY IU IUD
1.0000 | INTRAUTERINE_SYSTEM | Freq: Once | INTRAUTERINE | Status: AC
  Administered 2023-09-11: 1 via INTRAUTERINE
  Filled 2023-09-11: qty 1

## 2023-09-11 MED ORDER — PROMETHAZINE HCL 25 MG PO TABS
25.0000 mg | ORAL_TABLET | Freq: Once | ORAL | Status: AC
  Administered 2023-09-11: 25 mg via ORAL
  Filled 2023-09-11: qty 1

## 2023-09-11 MED ORDER — OXYTOCIN BOLUS FROM INFUSION
333.0000 mL | Freq: Once | INTRAVENOUS | Status: AC
  Administered 2023-09-11: 333 mL via INTRAVENOUS

## 2023-09-11 MED ORDER — ALBUTEROL SULFATE (2.5 MG/3ML) 0.083% IN NEBU: 2.5000 mg | INHALATION_SOLUTION | Freq: Four times a day (QID) | RESPIRATORY_TRACT | Status: DC | PRN

## 2023-09-11 MED ORDER — LIDOCAINE HCL (PF) 1 % IJ SOLN
INTRAMUSCULAR | Status: DC | PRN
  Administered 2023-09-11 (×2): 4 mL via EPIDURAL

## 2023-09-11 MED ORDER — ACETAMINOPHEN 325 MG PO TABS: 650.0000 mg | ORAL_TABLET | ORAL | Status: DC | PRN

## 2023-09-11 MED ORDER — PRENATAL MULTIVITAMIN CH
1.0000 | ORAL_TABLET | Freq: Every day | ORAL | Status: DC
  Administered 2023-09-12: 1 via ORAL
  Filled 2023-09-11: qty 1

## 2023-09-11 MED ORDER — LACTATED RINGERS IV SOLN
500.0000 mL | INTRAVENOUS | Status: DC | PRN
  Administered 2023-09-11: 1000 mL via INTRAVENOUS

## 2023-09-11 MED ORDER — SODIUM CHLORIDE 0.9% FLUSH: 3.0000 mL | INTRAVENOUS | Status: DC | PRN

## 2023-09-11 MED ORDER — TETANUS-DIPHTH-ACELL PERTUSSIS 5-2.5-18.5 LF-MCG/0.5 IM SUSY: 0.5000 mL | PREFILLED_SYRINGE | Freq: Once | INTRAMUSCULAR | Status: DC

## 2023-09-11 MED ORDER — ONDANSETRON HCL 4 MG/2ML IJ SOLN: 4.0000 mg | INTRAMUSCULAR | Status: DC | PRN

## 2023-09-11 MED ORDER — PHENYLEPHRINE 80 MCG/ML (10ML) SYRINGE FOR IV PUSH (FOR BLOOD PRESSURE SUPPORT)
80.0000 ug | PREFILLED_SYRINGE | INTRAVENOUS | Status: DC | PRN
  Filled 2023-09-11: qty 10

## 2023-09-11 MED ORDER — SENNOSIDES-DOCUSATE SODIUM 8.6-50 MG PO TABS
2.0000 | ORAL_TABLET | Freq: Every day | ORAL | Status: DC
  Administered 2023-09-12: 2 via ORAL
  Filled 2023-09-11: qty 2

## 2023-09-11 MED ORDER — CEFAZOLIN SODIUM-DEXTROSE 1-4 GM/50ML-% IV SOLN
1.0000 g | Freq: Three times a day (TID) | INTRAVENOUS | Status: DC
  Administered 2023-09-11: 1 g via INTRAVENOUS
  Filled 2023-09-11 (×3): qty 50

## 2023-09-11 MED ORDER — DIPHENHYDRAMINE HCL 50 MG/ML IJ SOLN: 12.5000 mg | INTRAMUSCULAR | Status: DC | PRN

## 2023-09-11 MED ORDER — ACETAMINOPHEN 325 MG PO TABS
650.0000 mg | ORAL_TABLET | ORAL | Status: DC | PRN
  Administered 2023-09-12: 650 mg via ORAL
  Filled 2023-09-11: qty 2

## 2023-09-11 MED ORDER — ONDANSETRON HCL 4 MG PO TABS: 4.0000 mg | ORAL_TABLET | ORAL | Status: DC | PRN

## 2023-09-11 MED ORDER — LIDOCAINE HCL (PF) 1 % IJ SOLN: 30.0000 mL | INTRAMUSCULAR | Status: DC | PRN

## 2023-09-11 MED ORDER — DIBUCAINE (PERIANAL) 1 % EX OINT: 1.0000 | TOPICAL_OINTMENT | CUTANEOUS | Status: DC | PRN

## 2023-09-11 MED ORDER — WITCH HAZEL-GLYCERIN EX PADS: 1.0000 | MEDICATED_PAD | CUTANEOUS | Status: DC | PRN

## 2023-09-11 NOTE — Discharge Summary (Signed)
Postpartum Discharge Summary  Date of Service updated     Patient Name: Carly Bates DOB: 2000-10-06 MRN: [id number]  Date of admission: 09/11/2023 Delivery date:09/11/2023 Delivering provider: Thyra Breed L Date of discharge: 09/12/2023  Admitting diagnosis: Pregnant [Z34.90] Intrauterine pregnancy: [redacted]w[redacted]d     Secondary diagnosis:  Principal Problem:   Pregnant  Additional problems: none    Discharge diagnosis: Term Pregnancy Delivered                                              Post partum procedures: postplacental IUD placed Augmentation: AROM Complications: None  Hospital course: Onset of Labor With Vaginal Delivery      23 y.o. yo [id number] at [redacted]w[redacted]d was admitted in Latent Labor on 09/11/2023. Labor course was complicated bynone  Membrane Rupture Time/Date: 9:58 AM,09/11/2023  Delivery Method:Vaginal, Spontaneous Operative Delivery:N/A Episiotomy: None Lacerations:  Labial Patient had a postpartum course complicated by N/A .  She is ambulating, tolerating a regular diet, passing flatus, and urinating well. Patient is discharged home in stable condition on 09/12/23.  Newborn Data: Birth date:09/11/2023 Birth time:5:09 PM Gender:Female Living status:Living Apgars:9 ,9  Weight:3310 g  Magnesium Sulfate received: No BMZ received: No Rhophylac:N/A MMR:N/A T-DaP:Given prenatally Flu: N/A declined  RSV Vaccine received: No Transfusion:No  Immunizations received: Immunization History  Administered Date(s) Administered   DTaP 06/25/2002, 09/19/2007, 11/14/2007, 06/17/2008   HIB (PRP-OMP) 06/25/2002   HPV 9-valent 10/06/2016   Hepatitis A 11/14/2007, 06/17/2008   Hepatitis B 06/25/2002, 09/19/2007, 11/14/2007   IPV 06/25/2002, 09/19/2007, 11/14/2007   Influenza Nasal 05/23/2008, 07/03/2008   Influenza,inj,Quad PF,6+ Mos 10/06/2016   MMR 09/19/2007, 11/14/2007   Meningococcal Conjugate 03/29/2014   Pneumococcal Conjugate-13 06/25/2002   Tdap  03/29/2014, 09/27/2021, 07/24/2023   Varicella 09/19/2007, 06/17/2008    Physical exam  Vitals:   09/12/23 0035 09/12/23 0415 09/12/23 0814 09/12/23 1538  BP: 127/77 124/82 118/74   Pulse: 88 78 66 71  Resp: 18 18 20 18   Temp: 98.3 F (36.8 C) 98.3 F (36.8 C) (!) 97.5 F (36.4 C) 98.6 F (37 C)  TempSrc: Oral Oral Oral Oral  SpO2: 100% 100%  100%  Weight:      Height:       (Please see physical exam from MD note)  Physical Exam:  General: alert, cooperative and no distress Chest: no respiratory distress Heart:regular rate, distal pulses intact Uterine Fundus: firm, appropriately tender DVT Evaluation: No calf swelling or tenderness Extremities: trace edema Skin: warm, dry  Labs: Lab Results  Component Value Date   WBC 10.6 (H) 09/12/2023   HGB 9.7 (L) 09/12/2023   HCT 30.0 (L) 09/12/2023   MCV 80.2 09/12/2023   PLT 285 09/12/2023      Latest Ref Rng & Units 03/14/2023    2:03 PM  CMP  Glucose 70 - 99 mg/dL 81   BUN 6 - 20 mg/dL 5   Creatinine 1.47 - 8.29 mg/dL 5.62   Sodium 130 - 865 mmol/L 138   Potassium 3.5 - 5.2 mmol/L 4.2   Chloride 96 - 106 mmol/L 104   CO2 20 - 29 mmol/L 21   Calcium 8.7 - 10.2 mg/dL 9.4   Total Protein 6.0 - 8.5 g/dL 7.1   Total Bilirubin 0.0 - 1.2 mg/dL 0.5   Alkaline Phos 44 - 121 IU/L 101   AST 0 -  40 IU/L 14   ALT 0 - 32 IU/L 8    Edinburgh Score:    09/12/2023    8:14 AM  Edinburgh Postnatal Depression Scale Screening Tool  I have been able to laugh and see the funny side of things. 0  I have looked forward with enjoyment to things. 0  I have blamed myself unnecessarily when things went wrong. 2  I have been anxious or worried for no good reason. 2  I have felt scared or panicky for no good reason. 1  Things have been getting on top of me. 1  I have been so unhappy that I have had difficulty sleeping. 2  I have felt sad or miserable. 1  I have been so unhappy that I have been crying. 1  The thought of harming myself  has occurred to me. 0  Edinburgh Postnatal Depression Scale Total 10   Edinburgh Postnatal Depression Scale Total: (!) 10   After visit meds:  Allergies as of 09/12/2023       Reactions   Penicillins Anaphylaxis   Has patient had a PCN reaction causing immediate rash, facial/tongue/throat swelling, SOB or lightheadedness with hypotension: Unknown Has patient had a PCN reaction causing severe rash involving mucus membranes or skin necrosis: Unknown Has patient had a PCN reaction that required hospitalization: Unknown Has patient had a PCN reaction occurring within the last 10 years: Unknown If all of the above answers are "NO", then may proceed with Cephalosporin use.        Medication List     STOP taking these medications    terconazole 0.4 % vaginal cream Commonly known as: TERAZOL 7       TAKE these medications    albuterol 108 (90 Base) MCG/ACT inhaler Commonly known as: VENTOLIN HFA Inhale 1-2 puffs into the lungs every 6 (six) hours as needed for wheezing or shortness of breath.   aspirin EC 81 MG tablet Take 2 tablets (162 mg total) by mouth daily. Swallow whole.   Doxylamine-Pyridoxine 10-10 MG Tbec Commonly known as: Diclegis Take 2 qhs; may also take one in am and one in afternoon prn nausea   ferrous sulfate 325 (65 FE) MG tablet Take 1 tablet (325 mg total) by mouth every other day. Start taking on: September 14, 2023   ibuprofen 600 MG tablet Commonly known as: ADVIL Take 1 tablet (600 mg total) by mouth every 6 (six) hours.   ondansetron 4 MG disintegrating tablet Commonly known as: ZOFRAN-ODT Take 1 tablet (4 mg total) by mouth every 6 (six) hours as needed for nausea or vomiting.   pantoprazole 20 MG tablet Commonly known as: Protonix Take 1 tablet (20 mg total) by mouth daily.   Prenatal Adult Gummy/DHA/FA 0.4-25 MG Chew Chew by mouth.         Discharge home in stable condition Infant Feeding: Bottle and Breast Infant  Disposition:home with mother Discharge instruction: per After Visit Summary and Postpartum booklet. Activity: Advance as tolerated. Pelvic rest for 6 weeks.  Diet: routine diet Future Appointments: Future Appointments  Date Time Provider Department Center  10/18/2023 10:30 AM Cheral Marker, CNM [facility]   Follow up Visit:  Please schedule this patient for Postpartum visit in: 6 weeks with the following provider: Any provider In-Person For C/S patients schedule nurse incision check in weeks 2 weeks: no Low risk pregnancy complicated by: none Delivery mode:  SVD Anticipated Birth Control:  PP IUD Placed PP Procedures needed: none  [place]:  10 prior to Discharge. Stable for Discharge and cleared by SW/ Follow up for a mood check in 1 week at [facility].   Schedule Integrated BH visit: yes   Updated message sent on 09/12/23 @ 1856  09/12/2023 Claudette Head, CNM

## 2023-09-11 NOTE — Anesthesia Procedure Notes (Signed)
 Epidural Patient location during procedure: OB Start time: 09/11/2023 8:49 AM End time: 09/11/2023 8:54 AM  Staffing Anesthesiologist: Conard Decent, MD Performed: anesthesiologist   Preanesthetic Checklist Completed: patient identified, IV checked, site marked, risks and benefits discussed, surgical consent, monitors and equipment checked, pre-op evaluation and timeout performed  Epidural Patient position: sitting Prep: DuraPrep and site prepped and draped Patient monitoring: continuous pulse ox and blood pressure Approach: midline Location: L3-L4 Injection technique: LOR saline  Needle:  Needle type: Tuohy  Needle gauge: 17 G Needle length: 9 cm and 9 Needle insertion depth: 6 cm Catheter type: closed end flexible Catheter size: 19 Gauge Catheter at skin depth: 10 cm Test dose: negative  Assessment Events: blood not aspirated, no cerebrospinal fluid, injection not painful, no injection resistance, no paresthesia and negative IV test  Additional Notes The patient has requested an epidural for labor pain management. Risks and benefits including, but not limited to, infection, bleeding, local anesthetic toxicity, headache, hypotension, back pain, block failure, etc. were discussed with the patient. The patient expressed understanding and consented to the procedure. I confirmed that the patient has no bleeding disorders and is not taking blood thinners. I confirmed the patient's last platelet count with the nurse. A time-out was performed immediately prior to the procedure. Please see nursing documentation for vital signs. Sterile technique was used throughout the whole procedure. Once LOR achieved, the epidural catheter threaded easily without resistance. Aspiration of the catheter was negative for blood and CSF. The epidural was dosed slowly and an infusion was started.  1 attempt(s)Reason for block:procedure for pain

## 2023-09-11 NOTE — Progress Notes (Addendum)
 Carly Bates is a 23 y.o. G2P1001 at [redacted]w[redacted]d admitted for SOL.   Subjective:   Comfortable s/p epidural. Alert, resting, reports intermittent nausea, Zofran  given by RN.   Objective: BP 102/82   Pulse (!) 121   Temp 98.7 F (37.1 C) (Oral)   Resp 16   Ht 5\' 3"  (1.6 m)   Wt 75.8 kg   LMP 12/05/2022   SpO2 99%   BMI 29.58 kg/m  No intake/output data recorded. No intake/output data recorded.  FHT:  FHR: 125 bpm, variability: moderate,  accelerations:  Present,  decelerations:  Absent UC: UI.  SVE:   Dilation: 5 Effacement (%): 80 Station: -1 Exam by:: S [place]   Assessment / Plan: Spontaneous labor, progressing normally  Labor: Progressing normally, in latent labor, w RBA of AROM. Patient consents to intervention. Moderate amount of clear fluid with bloody show. Patient tolerated well. Anticipate to active phase w/n the next 2-3 hours.  Fetal Wellbeing:  Category I Pain Control:  Epidural, comfortable.  I/D:   GBS pos. PCN allergy, tx w ancef .  Anticipated MOD:  NSVD  Summer L Huntley-Dale, Student-MidWife 09/11/2023, 10:11 AM

## 2023-09-11 NOTE — MAU Note (Signed)
.  Carly Bates is a 23 y.o. at [redacted]w[redacted]d here in MAU reporting: contractions that started at 3 am and increasing in intensity. Also reports having headache Denies hypertension Denies SROM, vaginal bleeding or bloody show.  Endorses + fetal movement Denies complications with pregnancy GBS + [facility]ce[facility] care at [facility]  Onset of complaint: 0300 Pain score: 6 lower abdomen/rectal Vitals:   09/11/23 0525  BP: 118/84  Pulse: 86  Resp: 17  Temp: 97.9 F (36.6 C)  SpO2: 100%     FHT:  125bpm Lab orders placed from triage:

## 2023-09-11 NOTE — Lactation Note (Signed)
 This note was copied from a baby's chart. Lactation Consultation Note  Patient Name: Girl Shamyra Kinsler [id number] Date: 09/11/2023 Age:22 hours  Mom chooses to formula feed.   Maternal Data    Feeding Nipple Type: Regular (20 cc)  LATCH Score                    Lactation Tools Discussed/Used    Interventions    Discharge    Consult Status Consult Status: Complete    Kathey Simer G 09/11/2023, 7:50 PM

## 2023-09-11 NOTE — Plan of Care (Signed)
Pt demonstrated understanding 

## 2023-09-11 NOTE — Anesthesia Preprocedure Evaluation (Signed)
 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 No current issues.  Normal echo 07/2023. Normal Ziopatch 06/2023.   Neuro/Psych negative neurological ROS     GI/Hepatic negative GI ROS, Neg liver ROS,,,  Endo/Other  negative endocrine ROS    Renal/GU negative Renal ROS     Musculoskeletal   Abdominal   Peds  Hematology negative hematology ROS (+) Lab Results      Component                Value               Date                      WBC                      7.7                 09/11/2023                HGB                      11.1 (L)            09/11/2023                HCT                      34.5 (L)            09/11/2023                MCV                      81.6                09/11/2023                PLT                      340                 09/11/2023              Anesthesia Other Findings   Reproductive/Obstetrics (+) Pregnancy                              Anesthesia Physical Anesthesia Plan  ASA: 2  Anesthesia Plan: Epidural   Post-op Pain Management:    Induction:   PONV Risk Score and Plan:   Airway Management Planned: Natural Airway  Additional Equipment:   Intra-op Plan:   Post-operative Plan:   Informed Consent: I have reviewed the patients History and Physical, chart, labs and discussed  the procedure including the risks, benefits and alternatives for the proposed anesthesia  with the patient or authorized representative who has indicated his/her understanding and acceptance.       Plan Discussed with: Anesthesiologist  Anesthesia Plan Comments: (I have discussed risks of neuraxial anesthesia including but not limited to infection, bleeding, nerve injury, back pain, headache, seizures, and failure of block. Patient denies bleeding disorders and is not currently anticoagulated. Labs have been reviewed. Risks and benefits discussed. All patient's questions answered.  )         Anesthesia Quick Evaluation

## 2023-09-11 NOTE — H&P (Signed)
 OBSTETRIC ADMISSION HISTORY AND PHYSICAL  Carly Bates is a 23 y.o. female G2P1001 with IUP at [redacted]w[redacted]d by 7wk US  presenting for contractions. She reports +FMs, No LOF, no VB. She plans on breast & formula feeding. She request postplacental Mirena  for birth control. 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 did not require transfusion  Past Medical History: 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(HCC)     Past Surgical History: Past Surgical History:  Procedure Laterality Date   CARDIAC SURGERY     47 months of age     Obstetrical History: OB History     Gravida  2   Para  1   Term  1   Preterm  0   AB  0   Living  1      SAB  0   IAB  0   Ectopic  0   Multiple  0   Live Births  1           Social History Social History   Socioeconomic History   Marital status: Significant Other    Spouse name: Not on file   Number of children: Not on file   Years of education: Not on file   Highest education level: Not on file  Occupational History   Not on file  Tobacco Use   Smoking status: Never   Smokeless tobacco: Never  Vaping Use   Vaping status: Never Used  Substance and Sexual Activity   Alcohol use: No   Drug use: No   Sexual activity: Yes    Partners: Male    Birth control/protection: None, Condom  Other Topics Concern   Not on file  Social History Narrative   8th grade       Lives with step-mom, father    Social Drivers of Health   Financial Resource Strain: Low Risk   (03/14/2023)   Overall Financial Resource Strain (CARDIA)    Difficulty of Paying Living Expenses: Not very hard  Food Insecurity: Food Insecurity Present (09/11/2023)   Hunger Vital Sign    Worried About Running Out of Food in the Last Year: Sometimes true    Ran Out of Food in the Last Year: Never true  Transportation Needs: No Transportation Needs (09/11/2023)   PRAPARE - Administrator, Civil Service (Medical): No    Lack of Transportation (Non-Medical): No  Physical Activity: Insufficiently Active (03/14/2023)   Exercise Vital Sign    Days of Exercise per Week: 3 days    Minutes of Exercise per Session: 30 min  Stress: No Stress Concern Present (03/14/2023)   [employer] of Occupational Health - Occupational Stress Questionnaire    Feeling of Stress : Not at all  Social Connections: Moderately Integrated (03/14/2023)   Social Connection and Isolation Panel [NHANES]    Frequency of Communication with Friends and Family: More than three times a week    Frequency of Social Gatherings with Friends and Family: Twice a week    Attends  Religious Services: 1 to 4 times per year    Active Member of Clubs or Organizations: No    Attends Banker Meetings: Never    Marital Status: Living with partner    Family History: Family History  Problem Relation Age of Onset   Diabetes Paternal Grandmother    Diabetes Maternal Grandmother    Cancer Maternal Grandfather        prostate   Hypertension Mother    Diabetes Sister     Allergies: Allergies  Allergen Reactions   Penicillins Anaphylaxis    Has patient had a PCN reaction causing immediate rash, facial/tongue/throat swelling, SOB or lightheadedness with hypotension: Unknown Has patient had a PCN reaction causing severe rash involving mucus membranes or skin necrosis: Unknown Has patient had a PCN reaction that required hospitalization: Unknown Has patient had a PCN reaction occurring within the last 10 years:  Unknown If all of the above answers are "NO", then may proceed with Cephalosporin use.     Medications Prior to Admission  Medication Sig Dispense Refill Last Dose/Taking   aspirin  EC 81 MG tablet Take 2 tablets (162 mg total) by mouth daily. Swallow whole. 180 tablet 2 Past Week   ondansetron  (ZOFRAN -ODT) 4 MG disintegrating tablet Take 1 tablet (4 mg total) by mouth every 6 (six) hours as needed for nausea or vomiting. 40 tablet 2 Past Week   Prenatal MV & Min w/FA-DHA (PRENATAL ADULT GUMMY/DHA/FA) 0.4-25 MG CHEW Chew by mouth.   09/10/2023   albuterol  (VENTOLIN  HFA) 108 (90 Base) MCG/ACT inhaler Inhale 1-2 puffs into the lungs every 6 (six) hours as needed for wheezing or shortness of breath. 18 g 0    Doxylamine -Pyridoxine  (DICLEGIS ) 10-10 MG TBEC Take 2 qhs; may also take one in am and one in afternoon prn nausea 120 tablet 6    pantoprazole  (PROTONIX ) 20 MG tablet Take 1 tablet (20 mg total) by mouth daily. 3 tablet 3    terconazole  (TERAZOL 7 ) 0.4 % vaginal cream Place 1 applicator vaginally at bedtime. X 7 nights (Patient not taking: Reported on 09/04/2023) 45 g 0    Review of Systems   All systems reviewed and negative except as stated in HPI  Blood pressure 102/82, pulse (!) 121, temperature 98.7 F (37.1 C), temperature source Oral, resp. rate 16, height 5\' 3"  (1.6 m), weight 75.8 kg, last menstrual period 12/05/2022, SpO2 99%, currently breastfeeding. General appearance: alert, cooperative, and appears stated age Lungs: clear to auscultation bilaterally Heart: regular rate and rhythm Abdomen: soft, non-tender; bowel sounds normal Pelvic: adequate Extremities: Homans sign is negative, no sign of DVT DTR's +1 Presentation: cephalic Fetal monitoringBaseline: 125 bpm, Variability: Good {> 6 bpm), Accelerations: Reactive, and Decelerations: Absent Uterine activity Frequency: Every 2-5 minutes, and Duration: 60-90 seconds Dilation: 5 Effacement (%): 80 Station: -1 Exam by:: S  [place]   Prenatal labs: ABO, Rh: --/--/O POS (02/10 0745) Antibody: NEG (02/10 0745) Rubella: 2.36 (08/13 1403) RPR: Non Reactive (11/25 0930)  HBsAg: Negative (08/13 1403)  HIV: Non Reactive (11/25 0930)  GBS: --/Positive (01/27 1000)  1 hr Glucola Normal Genetic screening  Low Risk Female Anatomy US : Normal Female  Prenatal Transfer Tool  Maternal Diabetes: No Genetic Screening: Normal Maternal Ultrasounds/Referrals: Normal Fetal Ultrasounds or other Referrals:  None Maternal Substance Abuse:  No Significant Maternal Medications:  None Significant Maternal Lab Results:  Group B Strep positive Number of Prenatal Visits:greater than 3 verified prenatal visits Other Comments:  None  Results for orders  placed or performed during the hospital encounter of 09/11/23 (from the past 24 hours)  CBC   Collection Time: 09/11/23  7:35 AM  Result Value Ref Range   WBC 7.7 4.0 - 10.5 K/uL   RBC 4.23 3.87 - 5.11 MIL/uL   Hemoglobin 11.1 (L) 12.0 - 15.0 g/dL   HCT 16.1 (L) 09.6 - 04.5 %   MCV 81.6 80.0 - 100.0 fL   MCH 26.2 26.0 - 34.0 pg   MCHC 32.2 30.0 - 36.0 g/dL   RDW 40.9 81.1 - 91.4 %   Platelets 340 150 - 400 K/uL   nRBC 0.0 0.0 - 0.2 %  Type and screen [facility]   Collection Time: 09/11/23  7:45 AM  Result Value Ref Range   ABO/RH(D) O POS    Antibody Screen NEG    Sample Expiration      09/14/2023,2359 Performed at [facility], [address], [place], [place] [address]     Patient Active Problem List   Diagnosis Date Noted   Pregnant 09/11/2023   Low amniotic fluid 06/26/2023   Low weight gain during pregnancy in second trimester 05/29/2023   History of postpartum hemorrhage, currently pregnant 04/27/2023   History of gestational hypertension & PPHTN 03/08/2023   Encounter for supervision of normal pregnancy, antepartum 03/08/2023   Short interval between pregnancies affecting pregnancy in first trimester, antepartum 02/03/2023    H/O Cardiopulmonary arrest with successful resuscitation (HCC) 06/14/2021   Mild intermittent asthma, uncomplicated 10/06/2016    Assessment/Plan:  Charyl Tomassetti is a 23 y.o. G2P1001 at [redacted]w[redacted]d here for SOL.   #Labor:SOL in latent phase. Plan AROM once patient comfortable w epidural and tx for GBS.  #Pain: Requesting epidural.  #FWB: Cat. 1  #ID:  GBS pos. Tx w ancef  due to PCN. Allergy.  #MOF: breast/bottle #MOC:post placental mirena   #Circ:  NA  Summer L Huntley-Dale, Student-MidWife  09/11/2023, 10:30 AM  Raford Bunk, MSN, CNM, RNC-OB Certified Nurse Midwife, [facility] 09/11/2023 6:30 PM

## 2023-09-12 LAB — CBC
HCT: 30 % — ABNORMAL LOW (ref 36.0–46.0)
Hemoglobin: 9.7 g/dL — ABNORMAL LOW (ref 12.0–15.0)
MCH: 25.9 pg — ABNORMAL LOW (ref 26.0–34.0)
MCHC: 32.3 g/dL (ref 30.0–36.0)
MCV: 80.2 fL (ref 80.0–100.0)
Platelets: 285 10*3/uL (ref 150–400)
RBC: 3.74 MIL/uL — ABNORMAL LOW (ref 3.87–5.11)
RDW: 14 % (ref 11.5–15.5)
WBC: 10.6 10*3/uL — ABNORMAL HIGH (ref 4.0–10.5)
nRBC: 0 % (ref 0.0–0.2)

## 2023-09-12 MED ORDER — FERROUS SULFATE 325 (65 FE) MG PO TABS
325.0000 mg | ORAL_TABLET | ORAL | Status: DC
  Administered 2023-09-12: 325 mg via ORAL
  Filled 2023-09-12: qty 1

## 2023-09-12 MED ORDER — FERROUS SULFATE 325 (65 FE) MG PO TABS
325.0000 mg | ORAL_TABLET | ORAL | 3 refills | Status: DC
Start: 2023-09-14 — End: 2024-03-25

## 2023-09-12 MED ORDER — IBUPROFEN 600 MG PO TABS: 600.0000 mg | ORAL_TABLET | Freq: Four times a day (QID) | ORAL | 0 refills | Status: DC

## 2023-09-12 NOTE — Social Work (Signed)
CSW received consult for an [place] Postnatal Depression Screen score of 10 and food insecurity. CSW met with MOB to offer support and complete assessment.  CSW entered the room and observed MOB feeding the infant and FOB at bedside. CSW introduced self, CSW role and reason for visit. MOB was agreeable to visit and allowed FOB to remain in the room. CSW inquired about how MOB was feeling MOB reported good. CSW inquired about MOB's mood over the past 7 days, MOB reported she felt more anxious than normal about the delivery. CSW inquired about any MH concerns MOB denied any hx of MH. CSW provided education regarding the baby blues period vs. perinatal mood disorders, discussed treatment and gave resources for mental health follow up if concerns arise.  CSW recommends self-evaluation during the postpartum time period using the New Mom Checklist from Postpartum Progress and encouraged MOB to contact a medical professional if symptoms are noted at any time. MOB identified FOB and her sister as her supports.   CSW inquired about noted food insecurity, MOB reported she received WIC and Food Stamps. MOB reported her [facility] card will not work, CSW encouraged MOB to reach ou the the [facility] office to discuss getting a new card.   CSW provided review of Sudden Infant Death Syndrome (SIDS) precautions.  MOB identified [facility] for infants follow up care. MOB reported they have all necessary items for the infant including a bassinet and car seat.  CSW identifies no further need for intervention and no barriers to discharge at this time.  Wende Neighbors, LCSWA Clinical Social Worker [phone]

## 2023-09-12 NOTE — Progress Notes (Signed)
RN Notified CNM that patient desires late D/C home this evening. Patient is 96 hours old. Orders placed   Cleatus Gabriel Danella Deis) Suzie Portela, MSN, CNM  [facility]  09/12/2023 6:57 PM

## 2023-09-12 NOTE — Progress Notes (Signed)
POSTPARTUM PROGRESS NOTE  Post Partum Day 1  Subjective:  Carly Bates is a 23 y.o. [id number] s/p SVD at [redacted]w[redacted]d.  She reports she is doing well. No acute events overnight. She denies any problems with ambulating, voiding or po intake. Denies nausea or vomiting.  Pain is well controlled.  Lochia is appropriate.  Objective: Blood pressure 118/74, pulse 66, temperature (!) 97.5 F (36.4 C), temperature source Oral, resp. rate 20, height 5\' 3"  (1.6 m), weight 75.8 kg, last menstrual period 12/05/2022, SpO2 100%, unknown if currently breastfeeding.  Physical Exam:  General: alert, cooperative and no distress Chest: no respiratory distress Heart:regular rate, distal pulses intact Uterine Fundus: firm, appropriately tender DVT Evaluation: No calf swelling or tenderness Extremities: trace edema Skin: warm, dry  Recent Labs    09/11/23 0735 09/12/23 0458  HGB 11.1* 9.7*  HCT 34.5* 30.0*    Assessment/Plan: Carly Bates is a 23 y.o. [id number] s/p SVD at [redacted]w[redacted]d   PPD#1 - Doing well  Routine postpartum care  Acute blood loss anemia following vaginal delivery, asymptomatic  Start PO Iron supplementation  Contraception: Post placental Mirena placed Feeding: Formula Dispo: Plan for discharge 2/12.   LOS: 1 day   Wyn Forster, MD OB Fellow  09/12/2023, 9:11 AM

## 2023-09-12 NOTE — Anesthesia Postprocedure Evaluation (Signed)
Anesthesia Post Note  Patient: Carly Bates  Procedure(s) Performed: AN AD HOC LABOR EPIDURAL     Patient location during evaluation: Mother Baby Anesthesia Type: Epidural Level of consciousness: awake, oriented and awake and alert Pain management: pain level controlled Vital Signs Assessment: post-procedure vital signs reviewed and stable Respiratory status: spontaneous breathing, respiratory function stable and nonlabored ventilation Cardiovascular status: stable Postop Assessment: no headache, adequate PO intake, able to ambulate, patient able to bend at knees and no apparent nausea or vomiting Anesthetic complications: no   No notable events documented.  Last Vitals:  Vitals:   09/12/23 0415 09/12/23 0814  BP: 124/82 118/74  Pulse: 78 66  Resp: 18 20  Temp: 36.8 C (!) 36.4 C  SpO2: 100%     Last Pain:  Vitals:   09/12/23 0814  TempSrc: Oral  PainSc: 0-No pain   Pain Goal: Patients Stated Pain Goal: 10 (09/11/23 0807)                 Tamanika Heiney

## 2023-09-14 ENCOUNTER — Telehealth (HOSPITAL_COMMUNITY): Payer: Self-pay | Admitting: *Deleted

## 2023-09-14 DIAGNOSIS — Z1331 Encounter for screening for depression: Secondary | ICD-10-CM

## 2023-09-14 NOTE — Telephone Encounter (Signed)
Hospital inpatient EPDS=10. Patient answered "never" to question #10. CSW consult completed during stay. Ambulatory IBH referral made now, per protocol, and Dr. Alvester Morin notified via chart. Paulene Floor, RN, 09/14/23, [phone]

## 2023-09-16 ENCOUNTER — Encounter: Payer: Self-pay | Admitting: Women's Health

## 2023-09-18 ENCOUNTER — Encounter: Payer: Medicaid Other | Admitting: Advanced Practice Midwife

## 2023-09-19 ENCOUNTER — Encounter: Payer: Medicaid Other | Admitting: Women's Health

## 2023-09-20 ENCOUNTER — Telehealth (HOSPITAL_COMMUNITY): Payer: Self-pay | Admitting: *Deleted

## 2023-09-20 NOTE — Telephone Encounter (Signed)
09/20/2023  Name: Merdis Snodgrass MRN: [id number] DOB: August 28, 2000  Reason for Call:  Transition of Care Hospital Discharge Call  Contact Status: Patient Contact Status: Complete  Language assistant needed: Interpreter Mode: Interpreter Not Needed        Follow-Up Questions: Do You Have Any Concerns About Your Health As You Heal From Delivery?: No Do You Have Any Concerns About Your Infants Health?: No  Edinburgh Postnatal Depression Scale:  In the Past 7 Days:    PHQ2-9 Depression Scale:     Discharge Follow-up: Edinburgh score requires follow up?:  (declines screening today over the phone, she reports that she is coping well at the moment) Patient was advised of the following resources:: Support Group, Breastfeeding Support Group (declined postpartum group information via email)  Post-discharge interventions: Reviewed Newborn Safe Sleep Practices  Salena Saner, RN 09/20/2023 13:53

## 2023-09-26 ENCOUNTER — Encounter: Payer: Self-pay | Admitting: Advanced Practice Midwife

## 2023-10-02 ENCOUNTER — Ambulatory Visit (INDEPENDENT_AMBULATORY_CARE_PROVIDER_SITE_OTHER): Payer: Medicaid Other | Admitting: Women's Health

## 2023-10-02 ENCOUNTER — Encounter: Payer: Self-pay | Admitting: Women's Health

## 2023-10-02 VITALS — BP 115/81 | HR 94 | Ht 63.2 in | Wt 155.0 lb

## 2023-10-02 DIAGNOSIS — Z30431 Encounter for routine checking of intrauterine contraceptive device: Secondary | ICD-10-CM

## 2023-10-02 NOTE — Patient Instructions (Signed)
 Tips To Increase Milk Supply Lots of water! Enough so that your urine is clear Plenty of calories, if you're not getting enough calories, your milk supply can decrease Breastfeed/pump often, every 2-3 hours x 20-33mins Fenugreek 3 pills 3 times a day, this may make your urine smell like maple syrup Mother's Milk Tea Lactation cookies, google for the recipe Real oatmeal Body Armor sports drinks Liquid Gold Greater Than hydration drink

## 2023-10-02 NOTE — Progress Notes (Signed)
   GYN VISIT Patient name: Carly Bates MRN [id number]  Date of birth: Jun 02, 2001 Chief Complaint:   Gynecologic Exam (Check Iud)  History of Present Illness:   Carly Bates is a 23 y.o. G73P2002 African-American female 3wks s/p SVB being seen today for IUD string check. Mirena IUD inserted postplacentally on 09/11/23. Reports strings hanging out since. Breast & bottlefeeding. No PPD/anxiety. PP visit 3/19 Patient's last menstrual period was 12/05/2022.     06/26/2023   11:40 AM 03/14/2023    9:23 AM 01/18/2023    2:20 PM 09/27/2021   10:07 AM 06/14/2021   10:01 AM  Depression screen PHQ 2/9  Decreased Interest 2 0 0 2 2  Down, Depressed, Hopeless 1 0 0 1 0  PHQ - 2 Score 3 0 0 3 2  Altered sleeping 1 0 0 1 1  Tired, decreased energy 1 0 0 2 1  Change in appetite 3 3 2 3 1   Feeling bad or failure about yourself  0 0 0 1 0  Trouble concentrating 0 1 0 2 0  Moving slowly or fidgety/restless 0 0 0 0 0  Suicidal thoughts 0 0 0 0 0  PHQ-9 Score 8 4 2 12 5         06/26/2023   11:42 AM 03/14/2023    9:23 AM 01/18/2023    2:21 PM 09/27/2021   10:07 AM  GAD 7 : Generalized Anxiety Score  Nervous, Anxious, on Edge 0 0 0 1  Control/stop worrying 1 0 0 1  Worry too much - different things 1 0 0 1  Trouble relaxing 0 0 0 1  Restless 0 0 0 0  Easily annoyed or irritable 0 0 0 1  Afraid - awful might happen 0 0 0 0  Total GAD 7 Score 2 0 0 5     Review of Systems:   Pertinent items are noted in HPI Denies fever/chills, dizziness, headaches, visual disturbances, fatigue, shortness of breath, chest pain, abdominal pain, vomiting, abnormal vaginal discharge/itching/odor/irritation, problems with periods, bowel movements, urination, or intercourse unless otherwise stated above.  Pertinent History Reviewed:  Reviewed past medical,surgical, social, obstetrical and family history.  Reviewed problem list, medications and allergies. Physical Assessment:   Vitals:   10/02/23 1014  BP:  115/81  Pulse: 94  Weight: 155 lb (70.3 kg)  Height: 5' 3.2" (1.605 m)  Body mass index is 27.28 kg/m.       Physical Examination:   General appearance: alert, well appearing, and in no distress  Mental status: alert, oriented to person, place, and time  Skin: warm & dry   Cardiovascular: normal heart rate noted  Respiratory: normal respiratory effort, no distress  Abdomen: soft, non-tender   Pelvic: visible IUD strings hanging out of vagina ~5cm, strings trimmed to ~3cm from cervix  Extremities: no edema   Chaperone: Peggy Dones  No results found for this or any previous visit (from the past 24 hours).  Assessment & Plan:  1) 3wks s/p SVB> breast & bottlefeeding, milk tips given  2) IUD string check> strings trimmed, no sex til after pp visit  Meds: No orders of the defined types were placed in this encounter.   No orders of the defined types were placed in this encounter.   Return for As scheduled.  Cheral Marker CNM, [facility] 10/02/2023 10:44 AM

## 2023-10-18 ENCOUNTER — Ambulatory Visit: Payer: Medicaid Other | Admitting: Women's Health

## 2024-01-22 ENCOUNTER — Ambulatory Visit: Admitting: Obstetrics and Gynecology

## 2024-02-28 ENCOUNTER — Ambulatory Visit: Admitting: Certified Nurse Midwife

## 2024-02-28 DIAGNOSIS — Z975 Presence of (intrauterine) contraceptive device: Secondary | ICD-10-CM

## 2024-02-28 DIAGNOSIS — Z01419 Encounter for gynecological examination (general) (routine) without abnormal findings: Secondary | ICD-10-CM

## 2024-03-25 ENCOUNTER — Other Ambulatory Visit (HOSPITAL_COMMUNITY)
Admission: RE | Admit: 2024-03-25 | Discharge: 2024-03-25 | Disposition: A | Discharge status: Home / Self Care | Source: Ambulatory Visit | Attending: Obstetrics & Gynecology | Admitting: Obstetrics & Gynecology

## 2024-03-25 ENCOUNTER — Encounter: Payer: Self-pay | Admitting: Obstetrics & Gynecology

## 2024-03-25 ENCOUNTER — Ambulatory Visit (INDEPENDENT_AMBULATORY_CARE_PROVIDER_SITE_OTHER): Admitting: Obstetrics & Gynecology

## 2024-03-25 VITALS — BP 111/77 | HR 91 | Wt 186.0 lb

## 2024-03-25 DIAGNOSIS — J4520 Mild intermittent asthma, uncomplicated: Secondary | ICD-10-CM | POA: Diagnosis not present

## 2024-03-25 DIAGNOSIS — Z975 Presence of (intrauterine) contraceptive device: Secondary | ICD-10-CM

## 2024-03-25 DIAGNOSIS — N921 Excessive and frequent menstruation with irregular cycle: Secondary | ICD-10-CM | POA: Diagnosis not present

## 2024-03-25 LAB — POCT URINE PREGNANCY: Preg Test, Ur: NEGATIVE

## 2024-03-25 MED ORDER — VENTOLIN HFA 108 (90 BASE) MCG/ACT IN AERS: 1.0000 | INHALATION_SPRAY | Freq: Four times a day (QID) | RESPIRATORY_TRACT | 5 refills | Status: AC | PRN

## 2024-03-25 MED ORDER — NORETHINDRONE ACETATE 5 MG PO TABS: 10.0000 mg | ORAL_TABLET | Freq: Every day | ORAL | 2 refills | Status: DC

## 2024-03-25 NOTE — Progress Notes (Signed)
 GYNECOLOGY OFFICE VISIT NOTE  History:  Carly Bates is a 23 y.o. [phone] here today for evaluation of AUB with Mirena  IUD in place.  Had postplacental placement of Mirena  on 09/11/23, had irregular bleeding since. Bleeding occurs most days of every month, says she does not bleeding 6-7 days of every month. She is tired of bleeding. No intervention thus far. She is sexually active and breastfeeding. She denies any abnormal vaginal discharge, pelvic pain or other concerns. Of note, patient desires refill of her albuterol  inhaler, no current symptoms.  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   Health Maintenance:  Normal pap on 01/18/2023.   Review of Systems:  Pertinent items noted in HPI and remainder of comprehensive ROS otherwise negative.  Physical Exam:  BP 111/77   Pulse 91   Wt 186 lb (84.4 kg)   BMI 32.74 kg/m  CONSTITUTIONAL: Well-developed, well-nourished female in no acute distress.  HEENT:  Normocephalic, atraumatic. External right and left ear normal. No scleral  icterus.  NECK: Normal range of motion, supple, no masses noted on observation SKIN: No rash noted. Not diaphoretic. No erythema. No pallor. MUSCULOSKELETAL: Normal range of motion. No edema noted. NEUROLOGIC: Alert and oriented to person, place, and time. Normal muscle tone coordination. No cranial nerve deficit noted on observation. PSYCHIATRIC: Normal mood and affect. Normal behavior. Normal judgment and thought content. CARDIOVASCULAR: Normal heart rate noted RESPIRATORY: Effort and breath sounds normal, no problems with respiration noted ABDOMEN: No masses or other overt distention noted on observation. No tenderness on palpation.   PELVIC: Deferred by patient, had normal IUD check during PP visit.  Labs and Imaging Results for orders placed or performed in visit on 03/25/24 (from the past week)  POCT urine pregnancy   Collection Time: 03/25/24  3:43 PM  Result Value Ref Range   Preg Test, Ur Negative Negative   No results found.  Assessment and Plan:     1. Mild intermittent asthma, uncomplicated Albuterol  inhaler refilled. She was advised to get evaluated by PCP, urgent care/ER for worsening symptoms. - albuterol  (VENTOLIN  HFA) 108 (90 Base) MCG/ACT inhaler; Inhale 1-2 puffs into the lungs every 6 (six) hours as needed for wheezing or shortness of breath.  Dispense: 54 g; Refill: 5  2. Abnormal uterine bleeding with Mirena  IUD in place (Primary) Patient was told irregular bleeding can happen in the first six months or so with the IUD, but given the amount and pattern, can do further evaluation and management.  Labs checked, had negative POCT UPT. Ultrasound ordered to evaluate IUD position and other possible  etiologies for bleeding. Discussed management with hormonal methods, but patient is concerned about effect of estrogen on her lactation. Will try Aygestin  for now, monitor effect.  Will follow up all studies and manage accordingly, bleeding precautions advised. - Cervicovaginal  ancillary only( [place]) - TSH Rfx on Abnormal to Free T4 - CBC - RPR+HBsAg+HCVAb+HIV - norethindrone  (AYGESTIN ) 5 MG tablet; Take 2 tablets (10 mg total) by mouth daily for 14 days.  Dispense: 28 tablet; Refill: 2 - POCT urine pregnancy - US  PELVIC COMPLETE WITH TRANSVAGINAL; Future  Return in about 3 weeks (around 04/15/2024) for follow up for bleeding, after ultrasound.    I spent 30 minutes dedicated to the care of this patient including pre-visit review of records, face to face time with the patient discussing her conditions and treatments, post visit ordering of medications and appropriate tests or procedures, coordinating care and documenting this visit encounter.    GLORIS HUGGER, MD, FACOG Obstetrician & Gynecologist, [facility] [employer], [facility]

## 2024-03-26 LAB — CBC
Hematocrit: 41.6 % (ref 34.0–46.6)
Hemoglobin: 13.4 g/dL (ref 11.1–15.9)
MCH: 29.1 pg (ref 26.6–33.0)
MCHC: 32.2 g/dL (ref 31.5–35.7)
MCV: 90 fL (ref 79–97)
Platelets: 499 x10E3/uL — ABNORMAL HIGH (ref 150–450)
RBC: 4.61 x10E6/uL (ref 3.77–5.28)
RDW: 13.5 % (ref 11.7–15.4)
WBC: 5.9 x10E3/uL (ref 3.4–10.8)

## 2024-03-26 LAB — TSH RFX ON ABNORMAL TO FREE T4: TSH: 1.57 u[IU]/mL (ref 0.450–4.500)

## 2024-03-26 LAB — RPR+HBSAG+HCVAB+...
HIV Screen 4th Generation wRfx: NONREACTIVE
Hep C Virus Ab: NONREACTIVE
Hepatitis B Surface Ag: NEGATIVE
RPR Ser Ql: NONREACTIVE

## 2024-03-27 ENCOUNTER — Ambulatory Visit: Payer: Self-pay | Admitting: Obstetrics & Gynecology

## 2024-03-27 LAB — CERVICOVAGINAL ANCILLARY ONLY
Bacterial Vaginitis (gardnerella): NEGATIVE
Candida Glabrata: NEGATIVE
Candida Vaginitis: NEGATIVE
Chlamydia: NEGATIVE
Comment: NEGATIVE
Comment: NEGATIVE
Comment: NEGATIVE
Comment: NEGATIVE
Comment: NEGATIVE
Comment: NORMAL
Neisseria Gonorrhea: NEGATIVE
Trichomonas: NEGATIVE

## 2024-03-28 ENCOUNTER — Ambulatory Visit
Admission: RE | Admit: 2024-03-28 | Discharge: 2024-03-28 | Disposition: A | Discharge status: Home / Self Care | Source: Ambulatory Visit | Attending: Obstetrics & Gynecology | Admitting: Obstetrics & Gynecology

## 2024-03-28 DIAGNOSIS — Z975 Presence of (intrauterine) contraceptive device: Secondary | ICD-10-CM | POA: Diagnosis not present

## 2024-03-28 DIAGNOSIS — N921 Excessive and frequent menstruation with irregular cycle: Secondary | ICD-10-CM | POA: Insufficient documentation

## 2024-04-01 ENCOUNTER — Other Ambulatory Visit: Payer: Self-pay | Admitting: Medical Genetics

## 2024-04-03 NOTE — Telephone Encounter (Signed)
-----   Message from Gloris Hugger sent at 03/30/2024  3:22 PM EDT ----- Possible malposition of IUD.  If bleeding is not controlled on medication, may have to remove IUD and decide on alternate contraceptive method.  Please call to inform patient of results and  recommendations.  ----- Message ----- From: Interface, Rad Results In Sent: 03/28/2024   7:07 PM EDT To: Gloris DELENA Hugger, MD

## 2024-04-03 NOTE — Telephone Encounter (Signed)
 TC to pt regarding provider advice on U/S and IUD possibly being malpositioned.  Pt would like to have IUD removed.

## 2024-04-11 ENCOUNTER — Other Ambulatory Visit

## 2024-04-19 ENCOUNTER — Encounter: Payer: Self-pay | Admitting: *Deleted

## 2024-04-30 ENCOUNTER — Ambulatory Visit: Admitting: Obstetrics & Gynecology

## 2024-05-20 ENCOUNTER — Encounter: Payer: Self-pay | Admitting: Family Medicine

## 2024-05-23 ENCOUNTER — Encounter: Payer: Self-pay | Admitting: Family Medicine

## 2024-05-23 ENCOUNTER — Ambulatory Visit: Admitting: Family Medicine

## 2024-05-23 VITALS — BP 123/83 | HR 92 | Wt 193.0 lb

## 2024-05-23 DIAGNOSIS — Z30432 Encounter for removal of intrauterine contraceptive device: Secondary | ICD-10-CM

## 2024-05-23 DIAGNOSIS — O99345 Other mental disorders complicating the puerperium: Secondary | ICD-10-CM | POA: Diagnosis not present

## 2024-05-23 DIAGNOSIS — F418 Other specified anxiety disorders: Secondary | ICD-10-CM | POA: Diagnosis not present

## 2024-05-23 DIAGNOSIS — Z3045 Encounter for surveillance of transdermal patch hormonal contraceptive device: Secondary | ICD-10-CM

## 2024-05-23 MED ORDER — NORELGESTROMIN-ETH ESTRADIOL 150-35 MCG/24HR TD PTWK: 1.0000 | MEDICATED_PATCH | TRANSDERMAL | 12 refills | Status: AC

## 2024-05-23 NOTE — Progress Notes (Signed)
   Subjective:    Patient ID: Carly Bates is a 23 y.o. female presenting with Anxiety and Contraception  on 05/23/2024  HPI:  Has gained weight on IUD. Would like this removed. Previously on patch and would like this again. Reports significant anxiety, worse in the pp period. Cannot sleep due to inability to turn off her thoughts.  Review of Systems  Constitutional:  Negative for chills and fever.  Respiratory:  Negative for shortness of breath.   Cardiovascular:  Negative for chest pain.  Gastrointestinal:  Negative for abdominal pain, nausea and vomiting.  Genitourinary:  Negative for dysuria.  Skin:  Negative for rash.      Objective:    BP 123/83   Pulse 92   Wt 193 lb (87.5 kg)   LMP 05/22/2024   BMI 33.97 kg/m  Physical Exam Exam conducted with a chaperone present.  Constitutional:      General: She is not in acute distress.    Appearance: She is well-developed.  HENT:     Head: Normocephalic and atraumatic.  Eyes:     General: No scleral icterus. Cardiovascular:     Rate and Rhythm: Normal rate.  Pulmonary:     Effort: Pulmonary effort is normal.  Abdominal:     Palpations: Abdomen is soft.  Genitourinary:    Comments: BUS normal, vagina is pink and rugated, cervix is parous without lesion Musculoskeletal:     Cervical back: Neck supple.  Skin:    General: Skin is warm and dry.  Neurological:     Mental Status: She is alert and oriented to person, place, and time.    Procedure: Speculum placed inside vagina.  Cervix visualized.  Strings grasped with ring forceps.  IUD removed intact.      Assessment & Plan:   Problem List Items Addressed This Visit       Unprioritized   Postpartum anxiety   New - offered and declined medication. Trial of mindfulness, meditation, yoga, IBH referral. Sleep hygiene reviewed at length.       Relevant Orders   Ambulatory referral to [facility]   Other Visit Diagnoses       Encounter for  IUD removal    -  Primary   s/p removal     Initial encounter for management of contraceptive patch use       begin Xulane.   Relevant Medications   norelgestromin -ethinyl estradiol  (XULANE) 150-35 MCG/24HR transdermal patch        Return in about 4 weeks (around 06/20/2024).  Glenys GORMAN Birk, MD 05/23/2024 10:51 AM

## 2024-05-23 NOTE — Progress Notes (Signed)
 RGYN here today to discuss anxiety and not being able to sleep.  Notes getting maybe 4-5 hrs of sleep.  LMP: 05/22/24 Pap:  01/18/23 WNL  Possibly get IUD out. Notes weight gain.

## 2024-05-23 NOTE — Assessment & Plan Note (Signed)
 New - offered and declined medication. Trial of mindfulness, meditation, yoga, IBH referral. Sleep hygiene reviewed at length.

## 2024-06-19 ENCOUNTER — Ambulatory Visit: Admitting: Certified Nurse Midwife

## 2024-06-19 DIAGNOSIS — O99345 Other mental disorders complicating the puerperium: Secondary | ICD-10-CM

## 2024-06-19 DIAGNOSIS — Z789 Other specified health status: Secondary | ICD-10-CM

## 2024-07-30 ENCOUNTER — Other Ambulatory Visit: Payer: Self-pay | Admitting: Medical Genetics

## 2024-07-30 DIAGNOSIS — Z006 Encounter for examination for normal comparison and control in clinical research program: Secondary | ICD-10-CM

## 2024-08-08 ENCOUNTER — Encounter: Payer: Self-pay | Admitting: Obstetrics & Gynecology

## 2024-08-08 ENCOUNTER — Other Ambulatory Visit: Payer: Self-pay | Admitting: *Deleted

## 2024-08-08 MED ORDER — FLUCONAZOLE 150 MG PO TABS: 150.0000 mg | ORAL_TABLET | Freq: Once | ORAL | 3 refills | Status: AC

## 2024-08-25 LAB — GENECONNECT MOLECULAR SCREEN: Genetic Analysis Overall Interpretation: NEGATIVE
